# Patient Record
Sex: Male | Born: 1945 | Race: White | Hispanic: No | Marital: Married | Smoking: Former smoker
Health system: Southern US, Community
[De-identification: ages and names within clinical notes are randomized; demographics above are authoritative.]

## PROBLEM LIST (undated history)

## (undated) DIAGNOSIS — E7800 Pure hypercholesterolemia, unspecified: Secondary | ICD-10-CM

## (undated) DIAGNOSIS — I10 Essential (primary) hypertension: Secondary | ICD-10-CM

## (undated) DIAGNOSIS — K219 Gastro-esophageal reflux disease without esophagitis: Secondary | ICD-10-CM

## (undated) DIAGNOSIS — E119 Type 2 diabetes mellitus without complications: Secondary | ICD-10-CM

## (undated) DIAGNOSIS — M1990 Unspecified osteoarthritis, unspecified site: Secondary | ICD-10-CM

## (undated) DIAGNOSIS — G629 Polyneuropathy, unspecified: Secondary | ICD-10-CM

## (undated) HISTORY — DX: Polyneuropathy, unspecified: G62.9

## (undated) HISTORY — PX: ROTATOR CUFF REPAIR: SHX139

## (undated) HISTORY — DX: Unspecified osteoarthritis, unspecified site: M19.90

## (undated) HISTORY — DX: Gastro-esophageal reflux disease without esophagitis: K21.9

## (undated) HISTORY — DX: Type 2 diabetes mellitus without complications: E11.9

## (undated) HISTORY — DX: Essential (primary) hypertension: I10

## (undated) HISTORY — PX: TURBINATE RESECTION: SHX293

## (undated) HISTORY — DX: Pure hypercholesterolemia, unspecified: E78.00

---

## 2001-12-27 DIAGNOSIS — C801 Malignant (primary) neoplasm, unspecified: Secondary | ICD-10-CM

## 2001-12-27 HISTORY — PX: HERNIA REPAIR: SHX51

## 2001-12-27 HISTORY — DX: Malignant (primary) neoplasm, unspecified: C80.1

## 2016-12-27 HISTORY — PX: BACK SURGERY: SHX140

## 2019-12-28 DIAGNOSIS — K2270 Barrett's esophagus without dysplasia: Secondary | ICD-10-CM

## 2019-12-28 HISTORY — DX: Barrett's esophagus without dysplasia: K22.70

## 2021-03-12 HISTORY — PX: COLONOSCOPY: SHX174

## 2021-05-07 ENCOUNTER — Ambulatory Visit (INDEPENDENT_AMBULATORY_CARE_PROVIDER_SITE_OTHER): Payer: Medicare Other | Admitting: Podiatry

## 2021-05-07 ENCOUNTER — Other Ambulatory Visit: Payer: Self-pay

## 2021-05-07 DIAGNOSIS — B351 Tinea unguium: Secondary | ICD-10-CM | POA: Diagnosis not present

## 2021-05-07 DIAGNOSIS — Z794 Long term (current) use of insulin: Secondary | ICD-10-CM

## 2021-05-07 DIAGNOSIS — E1142 Type 2 diabetes mellitus with diabetic polyneuropathy: Secondary | ICD-10-CM | POA: Diagnosis not present

## 2021-05-07 DIAGNOSIS — M79675 Pain in left toe(s): Secondary | ICD-10-CM | POA: Diagnosis not present

## 2021-05-07 DIAGNOSIS — M79674 Pain in right toe(s): Secondary | ICD-10-CM

## 2021-05-08 ENCOUNTER — Encounter: Payer: Self-pay | Admitting: Podiatry

## 2021-05-08 NOTE — Progress Notes (Signed)
Subjective: Dale Fox presents today referred by Johna Roles, PA for diabetic foot evaluation.  Patient relates greater than 3 year history of diabetes.  Patient denies any history of foot wounds.  Patient denies any history of numbness, tingling, burning, pins/needles sensations.  No past medical history on file.  There are no problems to display for this patient.     Current Outpatient Medications on File Prior to Visit  Medication Sig Dispense Refill  . amLODipine (NORVASC) 5 MG tablet Take by mouth.    . famotidine (PEPCID) 20 MG tablet TAKE 1 TABLET BY MOUTH ONCE DAILY AT BEDTIME AS NEEDED FOR 90 DAYS    . Lancets (ONETOUCH DELICA PLUS [id number]) MISC USE TO CHECK YOUR BLOOD SUGAR TO OBTAIN BLOOD ONCE DAILY    . aspirin 81 MG EC tablet Take by mouth.    Marland Kitchen atorvastatin (LIPITOR) 10 MG tablet Take 1 tablet by mouth daily.    Marland Kitchen BISACODYL 5 MG EC tablet See admin instructions.    . budesonide (PULMICORT) 0.5 MG/2ML nebulizer solution Inhale into the lungs.    . Fluticasone Propionate (XHANCE) 93 MCG/ACT EXHU Place into the nose.    . gabapentin (NEURONTIN) 100 MG capsule Take 100 mg by mouth daily.    Marland Kitchen levocetirizine (XYZAL) 5 MG tablet Take by mouth.    . losartan-hydrochlorothiazide (HYZAAR) 100-12.5 MG tablet Take 1 tablet by mouth daily.    . metFORMIN (GLUCOPHAGE-XR) 500 MG 24 hr tablet SMARTSIG:1 Tablet(s) By Mouth Every Evening    . ofloxacin (FLOXIN) 0.3 % OTIC solution 5 drops 2 (two) times daily.    Marland Kitchen omeprazole (PRILOSEC) 40 MG capsule Take 1 capsule by mouth every morning.     No current facility-administered medications on file prior to visit.     Allergies  Allergen Reactions  . Amoxicillin-Pot Clavulanate Swelling  . Tizanidine Swelling    Social History   Occupational History  . Not on file  Tobacco Use  . Smoking status: Not on file  . Smokeless tobacco: Not on file  Substance and Sexual Activity  . Alcohol use: Not on file  .  Drug use: Not on file  . Sexual activity: Not on file    No family history on file.   There is no immunization history on file for this patient.  Review of systems: Positive Findings in bold print.  Constitutional:  chills, fatigue, fever, sweats, weight change Communication: Optometrist, sign Ecologist, hand writing, iPad/Android device Head: headaches, head injury Eyes: changes in vision, eye pain, glaucoma, cataracts, macular degeneration, diplopia, glare,  light sensitivity, eyeglasses or contacts, blindness Ears nose mouth throat: hearing impaired, hearing aids,  ringing in ears, deaf, sign language,  vertigo, nosebleeds,  rhinitis,  cold sores, snoring, swollen glands Cardiovascular: HTN, edema, arrhythmia, pacemaker in place, defibrillator in place, chest pain/tightness, chronic anticoagulation, blood clot, heart failure, MI Peripheral Vascular: leg cramps, varicose veins, blood clots, lymphedema, varicosities Respiratory:  asthma, difficulty breathing, denies congestion, SOB, wheezing, cough, emphysema Gastrointestinal: change in appetite or weight, abdominal pain, constipation, diarrhea, nausea, vomiting, vomiting blood, change in bowel habits, abdominal pain, jaundice, rectal bleeding, hemorrhoids, GERD Genitourinary:  nocturia,  pain on urination, polyuria,  blood in urine, Foley catheter, urinary urgency, ESRD on hemodialysis Musculoskeletal: amputation, cramping, stiff joints, painful joints, decreased joint motion, fractures, OA, gout, hemiplegia, paraplegia, uses cane, wheelchair bound, uses walker, uses rollator Skin: +changes in toenails, color change, dryness, itching, mole changes,  rash, wound(s) Neurological: headaches, numbness in feet, paresthesias  in feet, burning in feet, fainting,  seizures, change in speech, migraines, memory problems/poor historian, cerebral palsy, weakness, paralysis, CVA, TIA Endocrine: diabetes, hypothyroidism, hyperthyroidism,  goiter,  dry mouth, flushing, heat intolerance, cold intolerance,  excessive thirst, denies polyuria,  nocturia Hematological:  easy bleeding, excessive bleeding, easy bruising, enlarged lymph nodes, on long term blood thinner, history of past transusions Allergy/immunological:  hives, eczema, frequent infections, multiple drug allergies, seasonal allergies, transplant recipient, multiple food allergies Psychiatric:  anxiety, depression, mood disorder, suicidal ideations, hallucinations, insomnia  Objective: There were no vitals filed for this visit. Vascular Examination: Capillary refill time less than 3 seconds x 10 digits.  Dorsalis pedis pulses palpable 2 out of 4.  Posterior tibial pulses palpable 2 out of 4.  Digital hair not present x 10 digits.  Skin temperature gradient WNL b/l.  Dermatological Examination: Skin with normal turgor, texture and tone b/l  Toenails 1-5 b/l discolored, thick, dystrophic with subungual debris and pain with palpation to nailbeds due to thickness of nails.  Musculoskeletal: Muscle strength 5/5 to all LE muscle groups.  Neurological: Sensation slightly diminished with 10 gram monofilament.  Vibratory sensation intact.  Assessment: 1. NIDDM 2. Encounter for diabetic foot examination  Plan: 1. Discussed diabetic foot care principles. Literature dispensed on today. 2. Patient to continue soft, supportive shoe gear daily. 3. Patient to report any pedal injuries to medical professional immediately. 4. Follow up one year. 5. Patient/POA to call should there be a concern in the interim.

## 2021-09-11 ENCOUNTER — Other Ambulatory Visit: Payer: Self-pay

## 2021-09-11 ENCOUNTER — Ambulatory Visit (INDEPENDENT_AMBULATORY_CARE_PROVIDER_SITE_OTHER): Payer: Medicare Other | Admitting: Podiatry

## 2021-09-11 DIAGNOSIS — M79674 Pain in right toe(s): Secondary | ICD-10-CM

## 2021-09-11 DIAGNOSIS — E1142 Type 2 diabetes mellitus with diabetic polyneuropathy: Secondary | ICD-10-CM | POA: Diagnosis not present

## 2021-09-11 DIAGNOSIS — M2041 Other hammer toe(s) (acquired), right foot: Secondary | ICD-10-CM | POA: Diagnosis not present

## 2021-09-11 DIAGNOSIS — Z794 Long term (current) use of insulin: Secondary | ICD-10-CM

## 2021-09-11 DIAGNOSIS — B351 Tinea unguium: Secondary | ICD-10-CM | POA: Diagnosis not present

## 2021-09-11 DIAGNOSIS — M2042 Other hammer toe(s) (acquired), left foot: Secondary | ICD-10-CM

## 2021-09-11 DIAGNOSIS — M79675 Pain in left toe(s): Secondary | ICD-10-CM

## 2021-09-11 NOTE — Progress Notes (Signed)
  Subjective:  Patient ID: Dale Fox, male    DOB: July 25, 1946,  MRN: [id number]  Chief Complaint  Patient presents with   Callouses    Callus pain    75 y.o. male returns for the above complaint.  Patient presents with thickened elongated dystrophic toenails x10.  Patient would like for me to debride down the painful to touch.  He is not able to do it himself.  He also would like to get diabetic shoes.  He states that he has not had them in the past.  He denies any other acute issues  Objective:  There were no vitals filed for this visit. Podiatric Exam: Vascular: dorsalis pedis and posterior tibial pulses are palpable bilateral. Capillary return is immediate. Temperature gradient is WNL. Skin turgor WNL  Sensorium: Normal Semmes Weinstein monofilament test. Normal tactile sensation bilaterally. Nail Exam: Pt has thick disfigured discolored nails with subungual debris noted bilateral entire nail hallux through fifth toenails.  Pain on palpation to the nails. Ulcer Exam: There is no evidence of ulcer or pre-ulcerative changes or infection. Orthopedic Exam: Muscle tone and strength are WNL. No limitations in general ROM. No crepitus or effusions noted.  Semiflexible hammertoe contractures noted 2 through 5 bilaterally.  Mild pain on palpation of Skin: No Porokeratosis. No infection or ulcers.      Assessment & Plan:   1. Hammertoe, bilateral   2. Type 2 diabetes mellitus with diabetic polyneuropathy, with long-term current use of insulin (HCC)   3. Pain due to onychomycosis of toenails of both feet     Patient was evaluated and treated and all questions answered.  Hammertoe bilateral - Patient the etiology of hammertoe contractures and various treatment options were discussed.  Given the nature of the contracture in the setting of diabetes I believe patient will benefit from diabetic shoes with insoles to allow space in depth and not put a lot of pressure and prevent  ulceration.  I discussed this with the patient and he states understand would like to obtain diabetic shoes -He will be scheduled with the nursing staff for diabetic shoes  Onychomycosis with pain  -Nails palliatively debrided as below. -Educated on self-care  Procedure: Nail Debridement Rationale: pain  Type of Debridement: manual, sharp debridement. Instrumentation: Nail nipper, rotary burr. Number of Nails: 10  Procedures and Treatment: Consent by patient was obtained for treatment procedures. The patient understood the discussion of treatment and procedures well. All questions were answered thoroughly reviewed. Debridement of mycotic and hypertrophic toenails, 1 through 5 bilateral and clearing of subungual debris. No ulceration, no infection noted.  Return Visit-Office Procedure: Patient instructed to return to the office for a follow up visit 3 months for continued evaluation and treatment.  Boneta Lucks, DPM    Return in about 3 months (around 12/11/2021), or With Posey Pronto for [place] [place].

## 2021-09-18 ENCOUNTER — Other Ambulatory Visit: Payer: Self-pay | Admitting: Physician Assistant

## 2021-09-18 ENCOUNTER — Ambulatory Visit
Admission: RE | Admit: 2021-09-18 | Discharge: 2021-09-18 | Disposition: A | Discharge status: Home / Self Care | Payer: Medicare Other | Source: Ambulatory Visit | Attending: Physician Assistant | Admitting: Physician Assistant

## 2021-09-18 DIAGNOSIS — R051 Acute cough: Secondary | ICD-10-CM

## 2021-09-21 ENCOUNTER — Ambulatory Visit (INDEPENDENT_AMBULATORY_CARE_PROVIDER_SITE_OTHER): Payer: Medicare Other | Admitting: Podiatry

## 2021-09-21 ENCOUNTER — Other Ambulatory Visit: Payer: Self-pay

## 2021-09-21 DIAGNOSIS — E1142 Type 2 diabetes mellitus with diabetic polyneuropathy: Secondary | ICD-10-CM

## 2021-09-21 DIAGNOSIS — M2042 Other hammer toe(s) (acquired), left foot: Secondary | ICD-10-CM

## 2021-09-21 DIAGNOSIS — Z794 Long term (current) use of insulin: Secondary | ICD-10-CM

## 2021-09-21 DIAGNOSIS — M2041 Other hammer toe(s) (acquired), right foot: Secondary | ICD-10-CM

## 2021-09-21 NOTE — Progress Notes (Signed)
Patient presented for foam casting for 3 pair custom diabetic shoe inserts. Patient is a size 9 1/2 wide  Diabetic shoes are chosen from the [employer] step catalog  The shoes chosen are 410   The patient will be contacted when the shoes and the inserts are ready to be picked up

## 2021-09-29 ENCOUNTER — Telehealth: Payer: Self-pay | Admitting: Podiatry

## 2021-09-29 NOTE — Telephone Encounter (Signed)
Left message for Rose the assistant for [facility] PA asking who the overseeing md/DO is for the PA.

## 2021-10-14 ENCOUNTER — Telehealth: Payer: Self-pay | Admitting: Podiatry

## 2021-10-14 NOTE — Telephone Encounter (Signed)
Pts wife left message asking if pts shoes have came in yet.She asked that I text her [place].  Upon looking the documents needed are not signed yet but pcp. I am not able to text pt and pt does not have my chart set up so I cannot send them a message that way.

## 2021-10-19 ENCOUNTER — Telehealth: Payer: Self-pay | Admitting: Podiatry

## 2021-10-19 NOTE — Telephone Encounter (Signed)
Pts spouse(Sandra) left message checking on status of pts diabetic shoes.  I returned call and explained we have faxed the documents needed 6 times and have not gotten it back.  She is calling the office tomorrow.

## 2021-10-27 ENCOUNTER — Ambulatory Visit
Admission: RE | Admit: 2021-10-27 | Discharge: 2021-10-27 | Disposition: A | Discharge status: Home / Self Care | Payer: Medicare Other | Source: Ambulatory Visit | Attending: Physician Assistant | Admitting: Physician Assistant

## 2021-10-27 ENCOUNTER — Other Ambulatory Visit: Payer: Self-pay | Admitting: Physician Assistant

## 2021-10-27 DIAGNOSIS — G8929 Other chronic pain: Secondary | ICD-10-CM

## 2021-10-27 DIAGNOSIS — M5489 Other dorsalgia: Secondary | ICD-10-CM

## 2021-11-04 ENCOUNTER — Encounter: Payer: Self-pay | Admitting: Pulmonary Disease

## 2021-11-04 ENCOUNTER — Other Ambulatory Visit: Payer: Self-pay

## 2021-11-04 ENCOUNTER — Telehealth: Payer: Self-pay | Admitting: Podiatry

## 2021-11-04 ENCOUNTER — Ambulatory Visit (INDEPENDENT_AMBULATORY_CARE_PROVIDER_SITE_OTHER): Payer: Medicare Other | Admitting: Pulmonary Disease

## 2021-11-04 VITALS — BP 140/70 | HR 65 | Temp 97.7°F | Ht 70.0 in | Wt 172.2 lb

## 2021-11-04 DIAGNOSIS — R053 Chronic cough: Secondary | ICD-10-CM | POA: Diagnosis not present

## 2021-11-04 NOTE — Patient Instructions (Signed)
Nice to meet you  I think the first up in the evaluation will be having a gastroenterologist to evaluate for ongoing silent reflux which could be triggering your dry cough.  Also, to help follow-up or evaluate for ongoing surveillance for Barrett's esophagus in the past.  I do not think asthma is a major cause, not improved with inhalers nor Dupixent.  Sounds like a sinus congestion, postnasal drip is much better after your procedures for polyps.  Do not think postnasal drip is a major issue either.  Is possible there is developed a feedback loop that is causing the cough.  This is usually triggered by nerves.  We may need to increase gabapentin in the future if reflux is not an issue per gastroenterology evaluation.  Please let me know what the GI doctor says and let me know when they have done tests or when they result and I can review as well.  Return to clinic in 3 months or sooner as needed with Dr. Silas Flood

## 2021-11-04 NOTE — Telephone Encounter (Signed)
Pts wife left message checking on status of diabetic shoes. She stated the pcp stated they had signed the documents and sent them back.  Upon checking we did get them and the inserts appear to be in production. It looks like they should be shipping in the next week or so and I notified pts wife of this and that I would call when they come in to schedule pt to pick them up.

## 2021-11-05 NOTE — Progress Notes (Signed)
@Patient  ID: Dale Fox, male    DOB: 01/30/1946, 75 y.o.   MRN: [id number]  Chief Complaint  Patient presents with   Follow-up    Pt states chronic cough x 15 years    Referring provider: Johna Roles, PA  HPI:   75 y.o. man whom we are seeing in consultation for evaluation of chronic cough.  Note from referring provider reviewed.  Patient has had cough for 15+ years.  It is dry, nonproductive.  Has coughing fits throughout the day.  Short burst of cough.  Fine in between.  Seems worse when lying supine, at night.  ICS/LABA inhalers have not been helpful.  His postnasal drip is well treated status post surgery, no further symptoms yet cough is no different.  He is on Dupixent for nasal polyposis and cough is no different.  He has GERD and is on famotidine as well as a PPI.  Cough is no different.  No other time of day where things are better or worse.  No seasonal environmental factors make things better or worse.  No other relieving or exacerbating factors.  He has a diagnosis of asthma throughout this that was related to cough.  Again, treated for this as you have no improvement in cough.  Reviewed chest x-ray 08/2021 is on my review and interpretation demonstrates clear lungs with hyperinflation, likely indicative of small airways disease or asthma.  He is a never smoker.  He has longstanding history of GERD.  He denies reflux or heartburn symptoms.  However, he does relate that he had Barrett's esophagus in 2019 at [facility].  Had planned further surveillance although he is unsure of plan.  Has moved and no longer has GI care.  PMH: Asthma, hypertension, nasal polyposis Surgical history: Endoscopic sinus surgery/polyp removal Family history:History reviewed. No pertinent family history. Social history: Never smoker, grew up in [place], now lives in [place], worked as a Environmental education officer, [place], and a nonprofit, now retired  Licensed conveyancer / Pulmonary  Flowsheets:   ACT:  No Duran found.  MMRC: No flowsheet data found.  Epworth:  No flowsheet data found.  Tests:   FENO:  No results found for: NITRICOXIDE  PFT: No flowsheet data found.  WALK:  No flowsheet data found.  Imaging: Personally reviewed and as per EMR discussion this note DG Lumbar Spine 2-3 Views  Result Date: 10/27/2021 CLINICAL DATA:  Low back pain EXAM: LUMBAR SPINE - 2-3 VIEW COMPARISON:  None. FINDINGS: Markedly limited evaluation due to overlapping osseous structures and overlying soft tissues. Five non-rib-bearing lumbar vertebral bodies. Multilevel moderate to severe degenerative changes of the spine. There is no evidence of lumbar spine fracture. Slightly levocurvature centered at the L3 level. Otherwise alignment is normal. Intervertebral disc spaces are maintained. IMPRESSION: No acute displaced fracture or traumatic listhesis of the lumbar spine in a patient with multilevel degenerative changes of the spine. Electronically Signed   By: Iven Finn M.D.   On: 10/27/2021 19:38   DG Pelvis 1-2 Views  Result Date: 10/27/2021 CLINICAL DATA:  Neck pain. EXAM: PELVIS - 1-2 VIEW COMPARISON:  None. FINDINGS: There is no evidence of pelvic fracture or diastasis. No pelvic bone lesions are seen. Prostate radiotherapy seeds are present. There are mild degenerative changes of both hips with joint space narrowing and osteophyte formation. IMPRESSION: 1. No acute bony abnormality. 2. Mild degenerative changes of the hips. Electronically Signed   By: Ronney Asters M.D.   On: 10/27/2021 19:50  Lab Results:  CBC No results found for: WBC, RBC, HGB, HCT, PLT, MCV, MCH, MCHC, RDW, LYMPHSABS, MONOABS, EOSABS, BASOSABS  BMET No results found for: NA, K, CL, CO2, GLUCOSE, BUN, CREATININE, CALCIUM, GFRNONAA, GFRAA  BNP No results found for: BNP  ProBNP No results found for: PROBNP  Specialty Problems   None   Allergies  Allergen Reactions    Amoxicillin-Pot Clavulanate Swelling   Tizanidine Swelling    Immunization History  Administered Date(s) Administered   Moderna Sars-Covid-2 Vaccination 10/22/2020, 04/25/2021    History reviewed. No pertinent past medical history.  Tobacco History: Social History   Tobacco Use  Smoking Status Former   Types: Cigarettes   Start date: 1962   Quit date: 1978   Years since quitting: 44.8  Smokeless Tobacco Never  Tobacco Comments   1 pack would last pt 1 week   Counseling given: Not Answered Tobacco comments: 1 pack would last pt 1 week   Continue to not smoke  Outpatient Encounter Medications as of 11/04/2021  Medication Sig   amLODipine (NORVASC) 5 MG tablet Take by mouth.   aspirin 81 MG EC tablet Take by mouth.   atorvastatin (LIPITOR) 10 MG tablet Take 1 tablet by mouth daily.   BISACODYL 5 MG EC tablet See admin instructions.   carvedilol (COREG) 6.25 MG tablet Take by mouth.   Dupilumab (DUPIXENT) 300 MG/2ML SOPN    famotidine (PEPCID) 20 MG tablet TAKE 1 TABLET BY MOUTH ONCE DAILY AT BEDTIME AS NEEDED FOR 90 DAYS   Fluticasone Propionate (XHANCE) 93 MCG/ACT EXHU Place into the nose.   gabapentin (NEURONTIN) 100 MG capsule Take 100 mg by mouth daily.   Lancets (ONETOUCH DELICA PLUS [id number]) MISC USE TO CHECK YOUR BLOOD SUGAR TO OBTAIN BLOOD ONCE DAILY   levocetirizine (XYZAL) 5 MG tablet Take by mouth.   losartan-hydrochlorothiazide (HYZAAR) 100-12.5 MG tablet Take 1 tablet by mouth daily.   metFORMIN (GLUCOPHAGE-XR) 500 MG 24 hr tablet SMARTSIG:1 Tablet(s) By Mouth Every Evening   ofloxacin (FLOXIN) 0.3 % OTIC solution 5 drops 2 (two) times daily.   ONETOUCH VERIO test strip SMARTSIG:Strip(s) Via Meter Daily   budesonide (PULMICORT) 0.5 MG/2ML nebulizer solution Inhale into the lungs. (Patient not taking: Reported on 11/04/2021)   omeprazole (PRILOSEC) 40 MG capsule Take 1 capsule by mouth every morning. (Patient not taking: Reported on 11/04/2021)   No  facility-administered encounter medications on file as of 11/04/2021.     Review of Systems  Review of Systems  No chest pain with exertion.  No orthopnea or PND.  No lower EXTR swelling.  Comprehensive review of systems otherwise negative. Physical Exam  BP 140/70 (BP Location: Left Arm, Patient Position: Sitting, Cuff Size: Normal)   Pulse 65   Temp 97.7 F (36.5 C) (Oral)   Ht 5\' 10"  (1.778 m)   Wt 172 lb 3.2 oz (78.1 kg)   SpO2 98%   BMI 24.71 kg/m   Wt Readings from Last 5 Encounters:  11/04/21 172 lb 3.2 oz (78.1 kg)    BMI Readings from Last 5 Encounters:  11/04/21 24.71 kg/m     Physical Exam General: Well-appearing, no acute distress Eyes: EOMI, icterus Neck: Supple, no JVP Pulmonary: Clear, good air excursion, normal work of breathing Cardiovascular: Regular rate and rhythm, no murmurs Abdomen: Nondistended, bowel sounds present MSK: No synovitis, no joint effusion Neuro: No weakness, normal gait Psych: Normal mood, full affect   Assessment & Plan:   Chronic cough: Likely initially multifactorial but  not present for 15+ years.  Suspect neurogenic at this point.  He has trialed and failed multiple ICS/LABA inhalers and currently is on Dupixent without improvement.  Asthma felt unlikely.  Prior had postnasal drip but after his sinus surgery for polyps he no longer has nasal congestion nor the sensation of postnasal drip.  Low suspicion for postnasal drip.  He has documented GERD.  Barrett's esophagus reportedly in 2019 while he lived in [place], [place].  Cough worse when lies down.  High suspicion for silent reflux particularly in the evenings with nocturnal worsening of symptoms while supine.  Referral to GI for further evaluation, consideration of manometry, pH probe.  Consider using gabapentin for neurogenic cough if reflux is ruled out.  Asthma: Well-controlled.  Gas trapping on chest x-ray.  No dyspnea symptoms.  Cough felt unrelated asthma given no change  with escalation of asthma therapies as above.   Return in about 3 months (around 02/04/2022).   Lanier Clam, MD 11/05/2021   This appointment required 65 minutes of patient care (this includes precharting, chart review, review of results, face-to-face care, etc.).

## 2021-11-09 ENCOUNTER — Encounter: Payer: Self-pay | Admitting: Gastroenterology

## 2021-11-26 ENCOUNTER — Other Ambulatory Visit: Payer: Self-pay

## 2021-11-26 ENCOUNTER — Ambulatory Visit: Payer: Medicare Other

## 2021-11-26 DIAGNOSIS — E1142 Type 2 diabetes mellitus with diabetic polyneuropathy: Secondary | ICD-10-CM | POA: Diagnosis not present

## 2021-11-26 DIAGNOSIS — M2042 Other hammer toe(s) (acquired), left foot: Secondary | ICD-10-CM | POA: Diagnosis not present

## 2021-11-26 DIAGNOSIS — M2041 Other hammer toe(s) (acquired), right foot: Secondary | ICD-10-CM | POA: Diagnosis not present

## 2021-11-26 NOTE — Progress Notes (Signed)
SITUATION Reason for Visit: Fitting of Diabetic Shoes & Insoles Patient / Caregiver Report:  Patient is comfortable in shoes  OBJECTIVE DATA: Patient History / Diagnosis:  Type 2 diabetes mellitus with diabetic polyneuropathy, with long-term current use of insulin ([place])  Change in Status:   None  ACTIONS PERFORMED: In-Person Delivery, patient was fit with: - 1x pair [id number] PDAC approved prefabricated Diabetic Shoes: Orthofeet 410 9.5W - 3x pair [id number] PDAC approved CAM milled custom diabetic insoles  Shoes and insoles were verified for structural integrity and safety. Patient wore shoes and insoles in office. Skin was inspected and free of areas of concern after wearing shoes and inserts. Shoes and inserts fit properly. Patient / Caregiver provided with ferbal instruction and demonstration regarding donning, doffing, wear, care, proper fit, function, purpose, cleaning, and use of shoes and insoles ' and in all related precautions and risks and benefits regarding shoes and insoles. Patient / Caregiver was instructed to wear properly fitting socks with shoes at all times. Patient was also provided with verbal instruction regarding how to report any failures or malfunctions of shoes or inserts, and necessary follow up care. Patient / Caregiver was also instructed to contact physician regarding change in status that may affect function of shoes and inserts.   Patient / Caregiver verbalized undersatnding of instruction provided. Patient / Caregiver demonstrated independence with proper donning and doffing of shoes and inserts.  PLAN Patient to follow up as needed. Plan of care was discussed with and agreed upon by patient and/or caregiver. All questions were answered and concerns addressed.

## 2021-12-07 ENCOUNTER — Other Ambulatory Visit: Payer: Self-pay

## 2021-12-07 ENCOUNTER — Ambulatory Visit (INDEPENDENT_AMBULATORY_CARE_PROVIDER_SITE_OTHER): Payer: Medicare Other | Admitting: Gastroenterology

## 2021-12-07 VITALS — BP 130/78 | HR 82 | Ht 70.0 in | Wt 173.0 lb

## 2021-12-07 DIAGNOSIS — R059 Cough, unspecified: Secondary | ICD-10-CM

## 2021-12-07 NOTE — Patient Instructions (Signed)
If you are age 76 or older, your body mass index should be between 23-30. Your Body mass index is 24.82 kg/m. If this is out of the aforementioned range listed, please consider follow up with your Primary Care Provider.  If you are age 71 or younger, your body mass index should be between 19-25. Your Body mass index is 24.82 kg/m. If this is out of the aformentioned range listed, please consider follow up with your Primary Care Provider.   ________________________________________________________  The [facility] providers would like to encourage you to use [facility] to communicate with providers for non-urgent requests or questions.  Due to long hold times on the telephone, sending your provider a message by [facility] may be a faster and more efficient way to get a response.  Please allow 48 business hours for a response.  Please remember that this is for non-urgent requests.  _______________________________________________________   Please call Dr. Leland Her nurse in 2 weeks at [phone]  to let her now how you are doing.   Thank you,  Dr. Jackquline Denmark

## 2021-12-07 NOTE — Progress Notes (Signed)
Chief Complaint: Cough  Referring Provider:  Johna Roles, PA      ASSESSMENT AND PLAN;   #1. Cough likely d/t Losartan. Doubt GERD as cause. Neg pulm/ENT eval.  #2. GERD with small HH/??Barrett's eso on Bx  Plan: -Protonix 40mg  po BID, pepcid 20mg  po QHS to continue as already being done. -Obtain Bx results from [facility] endoscopy, [place] (RE?  Barrett's). If +, rpt EGD 03/2023 -Cough is likely d/t losartan.  Recommend stopping it x 2 weeks to determine if cough improves. -Recommend to hold off on further GI WU until then.  If he still has problems, proceed with UGI series followed by 24h ph/manometry.  D/W pt and pt's wife They would get in touch with Minette Brine prior to stopping losartan   HPI:    Dale Fox is a 75 y.o. male  With DM2, HTN, HLD, OA, neuropathy  C/O cough x 15 yrs,  Starts off like tickle, then paroxysmal dry cough which happens even in sleep. Had extensive neg ENT eval, pulmonary eval Thought to be d/t GERD.  Has been on Protonix 40 BID and Pepcid 20 QHS  Continues to have cough.  No heartburn, odynophagia or dysphagia.  He denies having any regurgitation, N/V.  No diarrhea or constipation.  Very frustrated with cough.  So is his wife.  Previous GI work-up: Brings in his previous EGD report and pictures-from [place] endoscopy 04/08/2020 -Normal esophagus (biopsed) -Low-grade narrowing Schatzki's ring s/p balloon dilatation - NO further dysphagia. -Normal stomach (biopsed) -Normal duodenum We do not have biopsy reports yet.  Apparently patient was told that he had "Barrett's esophagus".  Colon 01/2021 [facility] GI): neg except polyps. Rpt  3 yrs.  Past Medical History:  Diagnosis Date   Barrett's esophagus 2021   Chronic GERD    Diabetes ([place])    High blood pressure    High cholesterol    Neuropathy    Osteoarthritis     Past Surgical History:  Procedure Laterality Date   BACK SURGERY  2018   HERNIA REPAIR  2003    ROTATOR CUFF REPAIR Right    2019   TURBINATE RESECTION      Family History  Problem Relation Age of Onset   Heart disease Mother    Colon cancer Neg Hx    Rectal cancer Neg Hx    Esophageal cancer Neg Hx     Social History   Tobacco Use   Smoking status: Former    Types: Cigarettes    Start date: 1962    Quit date: 1978    Years since quitting: 44.9   Smokeless tobacco: Never   Tobacco comments:    1 pack would last pt 1 week  Vaping Use   Vaping Use: Never used  Substance Use Topics   Alcohol use: Yes    Comment: occ   Drug use: Never    Current Outpatient Medications  Medication Sig Dispense Refill   amLODipine (NORVASC) 5 MG tablet Take by mouth.     aspirin 81 MG EC tablet Take by mouth.     atorvastatin (LIPITOR) 10 MG tablet Take 1 tablet by mouth daily.     benzonatate (TESSALON) 100 MG capsule Take 100 mg by mouth 3 (three) times daily.     carvedilol (COREG) 6.25 MG tablet Take by mouth.     Cyanocobalamin (VITAMIN B12 PO) Take 1 tablet by mouth daily.     Dupilumab (DUPIXENT) 300 MG/2ML SOPN  famotidine (PEPCID) 20 MG tablet TAKE 1 TABLET BY MOUTH ONCE DAILY AT BEDTIME AS NEEDED FOR 90 DAYS     gabapentin (NEURONTIN) 100 MG capsule Take 100 mg by mouth daily.     Lancets (ONETOUCH DELICA PLUS [id number]) MISC USE TO CHECK YOUR BLOOD SUGAR TO OBTAIN BLOOD ONCE DAILY     levocetirizine (XYZAL) 5 MG tablet Take by mouth.     losartan-hydrochlorothiazide (HYZAAR) 100-12.5 MG tablet Take 1 tablet by mouth daily.     metFORMIN (GLUCOPHAGE-XR) 500 MG 24 hr tablet SMARTSIG:1 Tablet(s) By Mouth Every Evening     montelukast (SINGULAIR) 10 MG tablet Take 10 mg by mouth daily.     Multiple Vitamin (MULTIVITAMIN) tablet Take 1 tablet by mouth daily.     ONETOUCH VERIO test strip SMARTSIG:Strip(s) Via Meter Daily     pantoprazole (PROTONIX) 40 MG tablet Take 40 mg by mouth 2 (two) times daily.     Probiotic Product (PROBIOTIC DAILY) CAPS Take 1 capsule by mouth  daily.     vitamin C (ASCORBIC ACID) 500 MG tablet Take 500 mg by mouth daily.     No current facility-administered medications for this visit.    Allergies  Allergen Reactions   Amoxicillin-Pot Clavulanate Swelling   Tizanidine Swelling    Review of Systems:  Constitutional: Denies fever, chills, diaphoresis, appetite change and fatigue.  HEENT: Denies photophobia, eye pain, redness, hearing loss, ear pain, congestion, sore throat, rhinorrhea, sneezing, mouth sores, neck pain, neck stiffness and tinnitus.   Respiratory: Denies SOB, DOE, cough, chest tightness,  and wheezing.   Cardiovascular: Denies chest pain, palpitations and leg swelling.  Genitourinary: Denies dysuria, urgency, frequency, hematuria, flank pain and difficulty urinating.  Musculoskeletal: Denies myalgias, back pain, joint swelling, arthralgias and gait problem.  Skin: No rash.  Neurological: Denies dizziness, seizures, syncope, weakness, light-headedness, numbness and headaches.  Hematological: Denies adenopathy. Easy bruising, personal or family bleeding history  Psychiatric/Behavioral: No anxiety or depression     Physical Exam:    BP 130/78   Pulse 82   Ht 5\' 10"  (1.778 m)   Wt 173 lb (78.5 kg)   SpO2 98%   BMI 24.82 kg/m  Wt Readings from Last 3 Encounters:  12/07/21 173 lb (78.5 kg)  11/04/21 172 lb 3.2 oz (78.1 kg)   Constitutional:  Well-developed, in no acute distress. Psychiatric: Normal mood and affect. Behavior is normal. HEENT: Pupils normal.  Conjunctivae are normal. No scleral icterus. Neck supple.  Cardiovascular: Normal rate, regular rhythm. No edema Pulmonary/chest: Effort normal and breath sounds normal. No wheezing, rales or rhonchi. Abdominal: Soft, nondistended. Nontender. Bowel sounds active throughout. There are no masses palpable. No hepatomegaly. Rectal: Deferred Neurological: Alert and oriented to person place and time. Skin: Skin is warm and dry. No rashes noted.  Data  Reviewed: I have personally reviewed following labs and imaging studies  No flowsheet data found.     Carmell Austria, MD 12/07/2021, 2:00 PM  Cc: Johna Roles, PA

## 2021-12-11 ENCOUNTER — Ambulatory Visit (INDEPENDENT_AMBULATORY_CARE_PROVIDER_SITE_OTHER): Payer: Medicare Other | Admitting: Podiatry

## 2021-12-11 ENCOUNTER — Other Ambulatory Visit: Payer: Self-pay

## 2021-12-11 DIAGNOSIS — Z794 Long term (current) use of insulin: Secondary | ICD-10-CM | POA: Diagnosis not present

## 2021-12-11 DIAGNOSIS — E1142 Type 2 diabetes mellitus with diabetic polyneuropathy: Secondary | ICD-10-CM | POA: Diagnosis not present

## 2021-12-11 DIAGNOSIS — M2041 Other hammer toe(s) (acquired), right foot: Secondary | ICD-10-CM

## 2021-12-11 DIAGNOSIS — M2042 Other hammer toe(s) (acquired), left foot: Secondary | ICD-10-CM | POA: Diagnosis not present

## 2021-12-15 NOTE — Progress Notes (Signed)
°  Subjective:  Patient ID: Dale Fox, male    DOB: 07-13-1946,  MRN: [id number]  Chief Complaint  Patient presents with   Diabetes    3 month follow up  PT stated that the shoes and orthotics are doing good    75 y.o. male returns for the above complaint.  Patient presents for follow-up to evaluate the diabetic shoes.  Patient states that there seem to be functioning fine he just wants to make sure that this is how it supposed to be.  He does not need any nail trims today.  He denies any other acute complaints  Objective:  There were no vitals filed for this visit. Podiatric Exam: Vascular: dorsalis pedis and posterior tibial pulses are palpable bilateral. Capillary return is immediate. Temperature gradient is WNL. Skin turgor WNL  Sensorium: Normal Semmes Weinstein monofilament test. Normal tactile sensation bilaterally. Nail Exam: Pt has thick disfigured discolored nails with subungual debris noted bilateral entire nail hallux through fifth toenails.  Pain on palpation to the nails. Ulcer Exam: There is no evidence of ulcer or pre-ulcerative changes or infection. Orthopedic Exam: Muscle tone and strength are WNL. No limitations in general ROM. No crepitus or effusions noted.  Semiflexible hammertoe contractures noted 2 through 5 bilaterally.  Mild pain on palpation of Skin: No Porokeratosis. No infection or ulcers.      Assessment & Plan:   1. Hammertoe, bilateral   2. Type 2 diabetes mellitus with diabetic polyneuropathy, with long-term current use of insulin ([place])      Patient was evaluated and treated and all questions answered.  Hammertoe bilateral - Patient the etiology of hammertoe contractures and various treatment options were discussed.  Given the nature of the contracture in the setting of diabetes I believe patient will benefit from diabetic shoes with insoles to allow space in depth and not put a lot of pressure and prevent ulceration.  I discussed this with  the patient and he states understand would like to obtain diabetic shoes -Diabetic shoes are fitting well and functioning well.  Onychomycosis with pain  -Nails palliatively debrided as below. -Educated on self-care  Procedure: Nail Debridement Rationale: pain  Type of Debridement: manual, sharp debridement. Instrumentation: Nail nipper, rotary burr. Number of Nails: 10  Procedures and Treatment: Consent by patient was obtained for treatment procedures. The patient understood the discussion of treatment and procedures well. All questions were answered thoroughly reviewed. Debridement of mycotic and hypertrophic toenails, 1 through 5 bilateral and clearing of subungual debris. No ulceration, no infection noted.  Return Visit-Office Procedure: Patient instructed to return to the office for a follow up visit 3 months for continued evaluation and treatment.  Boneta Lucks, DPM    No follow-ups on file.

## 2022-01-07 ENCOUNTER — Telehealth: Payer: Self-pay | Admitting: Gastroenterology

## 2022-01-07 NOTE — Telephone Encounter (Signed)
Patient called to give update on his cough.  He stopped taking the Losartan and the cough seemed to worsen.  He went to Urgent Care about a week ago and they prescribed gel eye drops and Afrin which seemed to help for a little while.  He states the cough has come back and the drops and Afrin are no longer helping him.  He has an appointment scheduled with Dr. Lyndel Safe on 1/31 for follow up.  Thank you.

## 2022-01-07 NOTE — Telephone Encounter (Signed)
Left message for pt to call back  °

## 2022-01-08 NOTE — Telephone Encounter (Signed)
°  Spoke with pt wife Katharine Look to confirm message that was sent prior.  Katharine Look stated that Dr. Lyndel Safe suggested that pt stop taking the Losartan.   Katharine Look reminded of the Appointment scheduled of 01/26/2022  Katharine Look verbalized understanding with all questions answered

## 2022-01-26 ENCOUNTER — Ambulatory Visit (INDEPENDENT_AMBULATORY_CARE_PROVIDER_SITE_OTHER): Payer: Medicare Other | Admitting: Gastroenterology

## 2022-01-26 ENCOUNTER — Encounter: Payer: Self-pay | Admitting: Gastroenterology

## 2022-01-26 ENCOUNTER — Other Ambulatory Visit: Payer: Self-pay

## 2022-01-26 VITALS — BP 140/74 | HR 83 | Ht 70.0 in | Wt 172.0 lb

## 2022-01-26 DIAGNOSIS — R059 Cough, unspecified: Secondary | ICD-10-CM

## 2022-01-26 DIAGNOSIS — R1319 Other dysphagia: Secondary | ICD-10-CM

## 2022-01-26 DIAGNOSIS — K219 Gastro-esophageal reflux disease without esophagitis: Secondary | ICD-10-CM | POA: Diagnosis not present

## 2022-01-26 DIAGNOSIS — K449 Diaphragmatic hernia without obstruction or gangrene: Secondary | ICD-10-CM

## 2022-01-26 NOTE — Patient Instructions (Addendum)
If you are age 77 or older, your body mass index should be between 23-30. Your Body mass index is 24.68 kg/m. If this is out of the aforementioned range listed, please consider follow up with your Primary Care Provider. __________________________________________________________  The [facility] providers would like to encourage you to use [facility] to communicate with providers for non-urgent requests or questions.  Due to long hold times on the telephone, sending your provider a message by [facility] may be a faster and more efficient way to get a response.  Please allow 48 business hours for a response.  Please remember that this is for non-urgent requests.    Due to recent changes in healthcare laws, you may see the results of your imaging and laboratory studies on MyChart before your provider has had a chance to review them.  We understand that in some cases there may be results that are confusing or concerning to you. Not all laboratory results come back in the same time frame and the provider may be waiting for multiple results in order to interpret others.  Please give [place] 48 hours in order for your provider to thoroughly review all the results before contacting the office for clarification of your results.   Continue taking Protonix, and Pepcid  You have been scheduled for an endoscopy. Please follow written instructions given to you at your visit today. If you use inhalers (even only as needed), please bring them with you on the day of your procedure.    You have been scheduled for a Barium Esophogram at [facility] (1st floor of the hospital) on 02/09/22 at 9:30am. Please arrive 15 minutes prior to your appointment for registration. Make certain not to have anything to eat or drink 3 hours prior to your test. If you need to reschedule for any reason, please contact radiology at [phone] to do so. __________________________________________________________________ A barium swallow is an  examination that concentrates on views of the esophagus. This tends to be a double contrast exam (barium and two liquids which, when combined, create a gas to distend the wall of the oesophagus) or single contrast (non-ionic iodine based). The study is usually tailored to your symptoms so a good history is essential. Attention is paid during the study to the form, structure and configuration of the esophagus, looking for functional disorders (such as aspiration, dysphagia, achalasia, motility and reflux) EXAMINATION You may be asked to change into a gown, depending on the type of swallow being performed. A radiologist and radiographer will perform the procedure. The radiologist will advise you of the type of contrast selected for your procedure and direct you during the exam. You will be asked to stand, sit or lie in several different positions and to hold a small amount of fluid in your mouth before being asked to swallow while the imaging is performed .In some instances you may be asked to swallow barium coated marshmallows to assess the motility of a solid food bolus. The exam can be recorded as a digital or video fluoroscopy procedure. POST PROCEDURE It will take 1-2 days for the barium to pass through your system. To facilitate this, it is important, unless otherwise directed, to increase your fluids for the next 24-48hrs and to resume your normal diet.  This test typically takes about 30 minutes to perform. __________________________________________________________________________________   It was a pleasure to see you today!  Jackquline Denmark, M.D.

## 2022-01-26 NOTE — Progress Notes (Signed)
Chief Complaint: FU  Referring Provider:  Johna Roles, PA      ASSESSMENT AND PLAN;   #1. GERD with eso dysphagia   #2. Cough ?  Etiology. Neg pulm/ENT eval/did not get better after stopping losartan.   Plan: -Protonix 40mg  po BID, pepcid 20mg  po QHS to continue as already being done. -Ba swallow with barium tablet (R/O Zenker's) -EGD with dil  Proceed with EGD. I have discussed the risks and benefits. The risks including rare risk of perforation, bleeding, missed UGI neoplasms, risks of anesthesia/sedation. Alternatives were given. Patient is aware and agrees to proceed. All the questions were answered. This will be scheduled in upcoming days. Consent forms were given for review.   HPI:    Dale Fox is a 76 y.o. male  With DM2, HTN, HLD, OA, neuropathy  C/O cough x 15 yrs,  Starts off like tickle, then paroxysmal dry cough which happens even in sleep. Had extensive neg ENT eval, pulmonary eval Thought to be d/t GERD.  Has been on Protonix 40 BID and Pepcid 20 QHS  Continues to have cough.  He stopped losartan x over 2 weeks without any relief from cough  Over the last few weeks, having problems with solid food dysphagia-food getting hung up in the lower chest.  He denies having any heartburn on twice daily Protonix and on Pepcid at bedtime.  No odynophagia.  No problems with liquids.   He denies having any regurgitation, N/V.  No diarrhea or constipation.  Very frustrated with cough.  So is his wife.  Wt Readings from Last 3 Encounters:  01/26/22 172 lb (78 kg)  12/07/21 173 lb (78.5 kg)  11/04/21 172 lb 3.2 oz (78.1 kg)     Previous GI work-up: Previous EGD report and pictures-from [place] endoscopy 04/08/2020 -Normal esophagus (biopsed) -Low-grade narrowing Schatzki's ring s/p balloon dilatation - NO further dysphagia. -Normal stomach (biopsed) -Normal duodenum We do not have biopsy reports yet.  Apparently patient was told that he had  "Barrett's esophagus".  Colon 01/2021 [facility] GI): neg except polyps. Rpt  3 yrs.  Past Medical History:  Diagnosis Date   Barrett's esophagus 2021   Chronic GERD    Diabetes ([place])    High blood pressure    High cholesterol    Neuropathy    Osteoarthritis     Past Surgical History:  Procedure Laterality Date   BACK SURGERY  2018   COLONOSCOPY  03/12/2021   [facility], Dr. Wilford Corner. One 3 mm polyp in the descending colon, removed with a cold biopsy forceps. Resected and retrived. Diverticulosis in the sigmoid colon. Internal hemorrhoids. The examined portion of the ileum was normal.   HERNIA REPAIR  2003   ROTATOR CUFF REPAIR Right    2019   TURBINATE RESECTION      Family History  Problem Relation Age of Onset   Heart disease Mother    Colon cancer Neg Hx    Rectal cancer Neg Hx    Esophageal cancer Neg Hx     Social History   Tobacco Use   Smoking status: Former    Types: Cigarettes    Start date: 1962    Quit date: 1978    Years since quitting: 45.1   Smokeless tobacco: Never   Tobacco comments:    1 pack would last pt 1 week  Vaping Use   Vaping Use: Never used  Substance Use Topics   Alcohol use: Yes  Comment: occ   Drug use: Never    Current Outpatient Medications  Medication Sig Dispense Refill   amLODipine (NORVASC) 5 MG tablet Take by mouth.     aspirin 81 MG EC tablet Take by mouth.     atorvastatin (LIPITOR) 10 MG tablet Take 1 tablet by mouth daily.     benzonatate (TESSALON) 100 MG capsule Take 100 mg by mouth 3 (three) times daily.     carvedilol (COREG) 6.25 MG tablet Take by mouth.     Cyanocobalamin (VITAMIN B12 PO) Take 1 tablet by mouth daily.     Dupilumab (DUPIXENT) 300 MG/2ML SOPN      famotidine (PEPCID) 20 MG tablet TAKE 1 TABLET BY MOUTH ONCE DAILY AT BEDTIME AS NEEDED FOR 90 DAYS     gabapentin (NEURONTIN) 100 MG capsule Take 100 mg by mouth daily.     Lancets (ONETOUCH DELICA PLUS [id number]) MISC USE TO CHECK YOUR  BLOOD SUGAR TO OBTAIN BLOOD ONCE DAILY     levocetirizine (XYZAL) 5 MG tablet Take by mouth.     losartan-hydrochlorothiazide (HYZAAR) 100-12.5 MG tablet Take 1 tablet by mouth daily.     metFORMIN (GLUCOPHAGE-XR) 500 MG 24 hr tablet SMARTSIG:1 Tablet(s) By Mouth Every Evening     montelukast (SINGULAIR) 10 MG tablet Take 10 mg by mouth daily.     Multiple Vitamin (MULTIVITAMIN) tablet Take 1 tablet by mouth daily.     ONETOUCH VERIO test strip SMARTSIG:Strip(s) Via Meter Daily     pantoprazole (PROTONIX) 40 MG tablet Take 40 mg by mouth 2 (two) times daily.     Probiotic Product (PROBIOTIC DAILY) CAPS Take 1 capsule by mouth daily.     vitamin C (ASCORBIC ACID) 500 MG tablet Take 500 mg by mouth daily.     No current facility-administered medications for this visit.    Allergies  Allergen Reactions   Amoxicillin-Pot Clavulanate Swelling   Tizanidine Swelling    Review of Systems:  Constitutional: Denies fever, chills, diaphoresis, appetite change and fatigue.  HEENT: Denies photophobia, eye pain, redness, hearing loss, ear pain, congestion, sore throat, rhinorrhea, sneezing, mouth sores, neck pain, neck stiffness and tinnitus.   Respiratory: Denies SOB, DOE, cough, chest tightness,  and wheezing.   Cardiovascular: Denies chest pain, palpitations and leg swelling.  Genitourinary: Denies dysuria, urgency, frequency, hematuria, flank pain and difficulty urinating.  Musculoskeletal: Denies myalgias, back pain, joint swelling, arthralgias and gait problem.  Skin: No rash.  Neurological: Denies dizziness, seizures, syncope, weakness, light-headedness, numbness and headaches.  Hematological: Denies adenopathy. Easy bruising, personal or family bleeding history  Psychiatric/Behavioral: No anxiety or depression     Physical Exam:    BP 140/74    Pulse 83    Ht 5\' 10"  (1.778 m)    Wt 172 lb (78 kg)    BMI 24.68 kg/m  Wt Readings from Last 3 Encounters:  01/26/22 172 lb (78 kg)   12/07/21 173 lb (78.5 kg)  11/04/21 172 lb 3.2 oz (78.1 kg)   Constitutional:  Well-developed, in no acute distress. Psychiatric: Normal mood and affect. Behavior is normal. HEENT: Pupils normal.  Conjunctivae are normal. No scleral icterus. Neck supple.  Cardiovascular: Normal rate, regular rhythm. No edema Pulmonary/chest: Effort normal and breath sounds normal. No wheezing, rales or rhonchi. Abdominal: Soft, nondistended. Nontender. Bowel sounds active throughout. There are no masses palpable. No hepatomegaly. Rectal: Deferred Neurological: Alert and oriented to person place and time. Skin: Skin is warm and dry. No rashes noted.  Data Reviewed: I have personally reviewed following labs and imaging studies  No flowsheet data found.     Carmell Austria, MD 01/26/2022, 1:38 PM  Cc: Johna Roles, PA

## 2022-02-04 ENCOUNTER — Ambulatory Visit: Payer: Medicare Other | Admitting: Pulmonary Disease

## 2022-02-09 ENCOUNTER — Other Ambulatory Visit: Payer: Self-pay

## 2022-02-09 ENCOUNTER — Ambulatory Visit (HOSPITAL_COMMUNITY)
Admission: RE | Admit: 2022-02-09 | Discharge: 2022-02-09 | Disposition: A | Discharge status: Home / Self Care | Payer: Medicare Other | Source: Ambulatory Visit | Attending: Gastroenterology | Admitting: Gastroenterology

## 2022-02-09 DIAGNOSIS — R1319 Other dysphagia: Secondary | ICD-10-CM

## 2022-02-09 DIAGNOSIS — K219 Gastro-esophageal reflux disease without esophagitis: Secondary | ICD-10-CM | POA: Diagnosis present

## 2022-02-09 DIAGNOSIS — K449 Diaphragmatic hernia without obstruction or gangrene: Secondary | ICD-10-CM

## 2022-03-01 ENCOUNTER — Encounter: Payer: Self-pay | Admitting: Gastroenterology

## 2022-03-01 ENCOUNTER — Ambulatory Visit (AMBULATORY_SURGERY_CENTER): Payer: Medicare Other | Admitting: Gastroenterology

## 2022-03-01 VITALS — BP 113/60 | HR 54 | Temp 96.9°F | Resp 9 | Ht 70.0 in | Wt 172.0 lb

## 2022-03-01 DIAGNOSIS — R1310 Dysphagia, unspecified: Secondary | ICD-10-CM

## 2022-03-01 DIAGNOSIS — K2960 Other gastritis without bleeding: Secondary | ICD-10-CM

## 2022-03-01 DIAGNOSIS — K222 Esophageal obstruction: Secondary | ICD-10-CM | POA: Diagnosis not present

## 2022-03-01 DIAGNOSIS — K229 Disease of esophagus, unspecified: Secondary | ICD-10-CM | POA: Diagnosis not present

## 2022-03-01 DIAGNOSIS — K2950 Unspecified chronic gastritis without bleeding: Secondary | ICD-10-CM | POA: Diagnosis not present

## 2022-03-01 DIAGNOSIS — K449 Diaphragmatic hernia without obstruction or gangrene: Secondary | ICD-10-CM | POA: Diagnosis not present

## 2022-03-01 DIAGNOSIS — K219 Gastro-esophageal reflux disease without esophagitis: Secondary | ICD-10-CM

## 2022-03-01 MED ORDER — SODIUM CHLORIDE 0.9 % IV SOLN: 500.0000 mL | Freq: Once | INTRAVENOUS | Status: DC

## 2022-03-01 NOTE — Progress Notes (Signed)
? ? ?Chief Complaint: FU ? ?Referring Provider:  Johna Roles, PA    ? ? ?ASSESSMENT AND PLAN;  ? ?#1. GERD with eso dysphagia  ? ?#2. Cough ?  Etiology. Neg pulm/ENT eval/did not get better after stopping losartan. ? ? ?Plan: ?-Protonix '40mg'$  po BID, pepcid '20mg'$  po QHS to continue as already being done. ?-Ba swallow with barium tablet (R/O Zenker's) ?-EGD with dil ? ?Proceed with EGD. I have discussed the risks and benefits. The risks including rare risk of perforation, bleeding, missed UGI neoplasms, risks of anesthesia/sedation. Alternatives were given. Patient is aware and agrees to proceed. All the questions were answered. This will be scheduled in upcoming days. Consent forms were given for review. ? ? ?HPI:   ? ?Dale Fox is a 76 y.o. male  ?With DM2, HTN, HLD, OA, neuropathy ? ?C/O cough x 15 yrs,  ?Starts off like tickle, then paroxysmal dry cough which happens even in sleep. ?Had extensive neg ENT eval, pulmonary eval ?Thought to be d/t GERD.  Has been on Protonix 40 BID and Pepcid 20 QHS ? ?Continues to have cough. ? ?He stopped losartan x over 2 weeks without any relief from cough ? ?Over the last few weeks, having problems with solid food dysphagia-food getting hung up in the lower chest.  He denies having any heartburn on twice daily Protonix and on Pepcid at bedtime.  No odynophagia.  No problems with liquids. ? ? ?He denies having any regurgitation, N/V.  No diarrhea or constipation. ? ?Very frustrated with cough.  So is his wife. ? ?Wt Readings from Last 3 Encounters:  ?03/01/22 172 lb (78 kg)  ?01/26/22 172 lb (78 kg)  ?12/07/21 173 lb (78.5 kg)  ? ? ? ?Previous GI work-up: ?Previous EGD report and pictures-from [facility] 04/08/2020 ?-Normal esophagus (biopsed) ?-Low-grade narrowing Schatzki's ring s/p balloon dilatation - NO further dysphagia. ?-Normal stomach (biopsed) ?-Normal duodenum ?We do not have biopsy reports yet.  Apparently patient was told that he had  "Barrett's esophagus". ? ?Colon 01/2021 [facility]): neg except polyps. Rpt  3 yrs. ? ?Past Medical History:  ?Diagnosis Date  ? Barrett's esophagus 2021  ? Cancer [facility]) 2003  ? prostate  ? Chronic GERD   ? Diabetes ([place])   ? High blood pressure   ? High cholesterol   ? Neuropathy   ? Osteoarthritis   ? ? ?Past Surgical History:  ?Procedure Laterality Date  ? BACK SURGERY  2018  ? COLONOSCOPY  03/12/2021  ? [facility], Dr. Wilford Corner. One 3 mm polyp in the descending colon, removed with a cold biopsy forceps. Resected and retrived. Diverticulosis in the sigmoid colon. Internal hemorrhoids. The examined portion of the ileum was normal.  ? HERNIA REPAIR  2003  ? ROTATOR CUFF REPAIR Right   ? 2019  ? TURBINATE RESECTION    ? ? ?Family History  ?Problem Relation Age of Onset  ? Heart disease Mother   ? Colon cancer Neg Hx   ? Rectal cancer Neg Hx   ? Esophageal cancer Neg Hx   ? Stomach cancer Neg Hx   ? ? ?Social History  ? ?Tobacco Use  ? Smoking status: Former  ?  Types: Cigarettes  ?  Start date: 1962  ?  Quit date: 20  ?  Years since quitting: 45.2  ? Smokeless tobacco: Never  ? Tobacco comments:  ?  1 pack would last pt 1 week  ?Vaping Use  ? Vaping Use:  Never used  ?Substance Use Topics  ? Alcohol use: Yes  ?  Comment: occ  ? Drug use: Never  ? ? ?Current Outpatient Medications  ?Medication Sig Dispense Refill  ? amLODipine (NORVASC) 5 MG tablet Take by mouth.    ? aspirin 81 MG EC tablet Take by mouth.    ? atorvastatin (LIPITOR) 10 MG tablet Take 1 tablet by mouth daily.    ? benzonatate (TESSALON) 100 MG capsule Take 100 mg by mouth 3 (three) times daily.    ? carvedilol (COREG) 6.25 MG tablet Take by mouth.    ? Cyanocobalamin (VITAMIN B12 PO) Take 1 tablet by mouth daily.    ? Dupilumab (DUPIXENT) 300 MG/2ML SOPN     ? famotidine (PEPCID) 20 MG tablet TAKE 1 TABLET BY MOUTH ONCE DAILY AT BEDTIME AS NEEDED FOR 90 DAYS    ? gabapentin (NEURONTIN) 100 MG capsule Take 100 mg by mouth daily.    ?  Lancets (ONETOUCH DELICA PLUS [id number]) MISC USE TO CHECK YOUR BLOOD SUGAR TO OBTAIN BLOOD ONCE DAILY    ? levocetirizine (XYZAL) 5 MG tablet Take by mouth.    ? losartan-hydrochlorothiazide (HYZAAR) 100-12.5 MG tablet Take 1 tablet by mouth daily.    ? metFORMIN (GLUCOPHAGE-XR) 500 MG 24 hr tablet SMARTSIG:1 Tablet(s) By Mouth Every Evening    ? montelukast (SINGULAIR) 10 MG tablet Take 10 mg by mouth daily.    ? Multiple Vitamin (MULTIVITAMIN) tablet Take 1 tablet by mouth daily.    ? ONETOUCH VERIO test strip SMARTSIG:Strip(s) Via Meter Daily    ? pantoprazole (PROTONIX) 40 MG tablet Take 40 mg by mouth 2 (two) times daily.    ? Probiotic Product (PROBIOTIC DAILY) CAPS Take 1 capsule by mouth daily.    ? vitamin C (ASCORBIC ACID) 500 MG tablet Take 500 mg by mouth daily.    ? ?Current Facility-Administered Medications  ?Medication Dose Route Frequency Provider Last Rate Last Admin  ? 0.9 %  sodium chloride infusion  500 mL Intravenous Once Jackquline Denmark, MD      ? ? ?Allergies  ?Allergen Reactions  ? Amoxicillin-Pot Clavulanate Swelling  ? Tizanidine Swelling  ? ? ?Review of Systems:  ?Constitutional: Denies fever, chills, diaphoresis, appetite change and fatigue.  ?HEENT: Denies photophobia, eye pain, redness, hearing loss, ear pain, congestion, sore throat, rhinorrhea, sneezing, mouth sores, neck pain, neck stiffness and tinnitus.   ?Respiratory: Denies SOB, DOE, cough, chest tightness,  and wheezing.   ?Cardiovascular: Denies chest pain, palpitations and leg swelling.  ?Genitourinary: Denies dysuria, urgency, frequency, hematuria, flank pain and difficulty urinating.  ?Musculoskeletal: Denies myalgias, back pain, joint swelling, arthralgias and gait problem.  ?Skin: No rash.  ?Neurological: Denies dizziness, seizures, syncope, weakness, light-headedness, numbness and headaches.  ?Hematological: Denies adenopathy. Easy bruising, personal or family bleeding history  ?Psychiatric/Behavioral: No anxiety or  depression ? ?  ? ?Physical Exam:   ? ?BP (!) 133/57   Pulse 63   Temp (!) 96.9 ?F (36.1 ?C) (Temporal)   Ht '5\' 10"'$  (1.778 m)   Wt 172 lb (78 kg)   SpO2 98%   BMI 24.68 kg/m?  ?Wt Readings from Last 3 Encounters:  ?03/01/22 172 lb (78 kg)  ?01/26/22 172 lb (78 kg)  ?12/07/21 173 lb (78.5 kg)  ? ?Constitutional:  Well-developed, in no acute distress. ?Psychiatric: Normal mood and affect. Behavior is normal. ?HEENT: Pupils normal.  Conjunctivae are normal. No scleral icterus. ?Neck supple.  ?Cardiovascular: Normal rate, regular rhythm. No edema ?  Pulmonary/chest: Effort normal and breath sounds normal. No wheezing, rales or rhonchi. ?Abdominal: Soft, nondistended. Nontender. Bowel sounds active throughout. There are no masses palpable. No hepatomegaly. ?Rectal: Deferred ?Neurological: Alert and oriented to person place and time. ?Skin: Skin is warm and dry. No rashes noted. ? ?Data Reviewed: I have personally reviewed following labs and imaging studies ? ?No flowsheet data found. ? ? ? ? ?Carmell Austria, MD 03/01/2022, 10:01 AM ? ?Cc: Johna Roles, PA ? ? ?

## 2022-03-01 NOTE — Progress Notes (Signed)
Called to room to assist during endoscopic procedure.  Patient ID and intended procedure confirmed with present staff. Received instructions for my participation in the procedure from the performing physician.  

## 2022-03-01 NOTE — Patient Instructions (Signed)
Handouts Provided:  Post Dilation Diet ? ?YOU HAD AN ENDOSCOPIC PROCEDURE TODAY AT THE [facility]:   Refer to the procedure report that was given to you for any specific questions about what was found during the examination.  If the procedure report does not answer your questions, please call your gastroenterologist to clarify.  If you requested that your care partner not be given the details of your procedure findings, then the procedure report has been included in a sealed envelope for you to review at your convenience later. ? ?YOU SHOULD EXPECT: Some feelings of bloating in the abdomen. Passage of more gas than usual.  Walking can help get rid of the air that was put into your GI tract during the procedure and reduce the bloating. If you had a lower endoscopy (such as a colonoscopy or flexible sigmoidoscopy) you may notice spotting of blood in your stool or on the toilet paper. If you underwent a bowel prep for your procedure, you may not have a normal bowel movement for a few days. ? ?Please Note:  You might notice some irritation and congestion in your nose or some drainage.  This is from the oxygen used during your procedure.  There is no need for concern and it should clear up in a day or so. ? ?SYMPTOMS TO REPORT IMMEDIATELY: ? ?Following upper endoscopy (EGD) ? Vomiting of blood or coffee ground material ? New chest pain or pain under the shoulder blades ? Painful or persistently difficult swallowing ? New shortness of breath ? Fever of 100?F or higher ? Black, tarry-looking stools ? ?For urgent or emergent issues, a gastroenterologist can be reached at any hour by calling [phone]. ?Do not use MyChart messaging for urgent concerns.  ? ? ?DIET:  See Post Dilation Diet provided.  Drink plenty of fluids but you should avoid alcoholic beverages for 24 hours. ? ?ACTIVITY:  You should plan to take it easy for the rest of today and you should NOT DRIVE or use heavy machinery until tomorrow  (because of the sedation medicines used during the test).   ? ?FOLLOW UP: ?Our staff will call the number listed on your records 48-72 hours following your procedure to check on you and address any questions or concerns that you may have regarding the information given to you following your procedure. If we do not reach you, we will leave a message.  We will attempt to reach you two times.  During this call, we will ask if you have developed any symptoms of COVID 19. If you develop any symptoms (ie: fever, flu-like symptoms, shortness of breath, cough etc.) before then, please call [phone].  If you test positive for Covid 19 in the 2 weeks post procedure, please call and report this information to us.   ? ?If any biopsies were taken you will be contacted by phone or by letter within the next 1-3 weeks.  Please call us at [phone] if you have not heard about the biopsies in 3 weeks.  ? ? ?SIGNATURES/CONFIDENTIALITY: ?You and/or your care partner have signed paperwork which will be entered into your electronic medical record.  These signatures attest to the fact that that the information above on your After Visit Summary has been reviewed and is understood.  Full responsibility of the confidentiality of this discharge information lies with you and/or your care-partner. ? ?

## 2022-03-01 NOTE — Op Note (Signed)
[facility] ?Patient Name: Dale Fox ?Procedure Date: 03/01/2022 10:01 AM ?MRN: [id number] ?Endoscopist: Jackquline Denmark , MD ?Age: 76 ?Referring MD:  ?Date of Birth: 1946/11/23 ?Gender: Male ?Account #: [id number] ?Procedure:                Upper GI endoscopy ?Indications:              Dysphagia ?Medicines:                Monitored Anesthesia Care ?Procedure:                Pre-Anesthesia Assessment: ?                          - Prior to the procedure, a History and Physical  ?                          was performed, and patient medications and  ?                          allergies were reviewed. The patient's tolerance of  ?                          previous anesthesia was also reviewed. The risks  ?                          and benefits of the procedure and the sedation  ?                          options and risks were discussed with the patient.  ?                          All questions were answered, and informed consent  ?                          was obtained. Prior Anticoagulants: The patient has  ?                          taken no previous anticoagulant or antiplatelet  ?                          agents. ASA Grade Assessment: II - A patient with  ?                          mild systemic disease. After reviewing the risks  ?                          and benefits, the patient was deemed in  ?                          satisfactory condition to undergo the procedure. ?                          After obtaining informed consent, the endoscope was  ?  passed under direct vision. Throughout the  ?                          procedure, the patient's blood pressure, pulse, and  ?                          oxygen saturations were monitored continuously. The  ?                          GIF HQ190 #[id number] was introduced through the  ?                          mouth, and advanced to the second part of duodenum.  ?                          The upper GI endoscopy was accomplished  without  ?                          difficulty. The patient tolerated the procedure  ?                          well. ?Scope In: ?Scope Out: ?Findings:                 The examined esophagus was mildly tortuous.  ?                          Biopsies were obtained from the proximal and distal  ?                          esophagus with cold forceps for histology tor r/o  ?                          eosinophilic esophagitis. ?                          A non-obstructing and mild Schatzki ring was found  ?                          at the gastroesophageal junction, 40 cm from the  ?                          incisors. The scope was withdrawn. Dilation was  ?                          performed with a Maloney dilator with mild  ?                          resistance at 52 Fr. ?                          A 2 cm hiatal hernia was present. ?                          Localized mild inflammation characterized by  ?  erythema was found in the gastric antrum. Biopsies  ?                          were taken with a cold forceps for histology. ?                          The examined duodenum was normal. A benign  ?                          stricture was noted at the junction of first and  ?                          second portion of the duodenum which did allow GIF  ?                          H190. ?Complications:            No immediate complications. ?Estimated Blood Loss:     Estimated blood loss: none. ?Impression:               - Presbyesophagus s/p dilatation. ?                          - Non-obstructing and mild Schatzki ring. Dilated. ?                          - 2 cm hiatal hernia. ?                          - Gastritis. Biopsied. ?Recommendation:           - Patient has a contact number available for  ?                          emergencies. The signs and symptoms of potential  ?                          delayed complications were discussed with the  ?                          patient. Return to normal  activities tomorrow.  ?                          Written discharge instructions were provided to the  ?                          patient. ?                          - Post dilatation diet. ?                          - Continue present medications. ?                          - Await pathology results. ?                          -  Chew foods specially meats and breads well and  ?                          eat slowly. ?                          - The findings and recommendations were discussed  ?                          with the patient's family. ?Jackquline Denmark, MD ?03/01/2022 10:19:49 AM ?This report has been signed electronically. ?

## 2022-03-01 NOTE — Progress Notes (Signed)
To pacu, VSS. Report to [email or web address] 

## 2022-03-03 ENCOUNTER — Telehealth: Payer: Self-pay

## 2022-03-03 NOTE — Telephone Encounter (Signed)
?  Follow up Call- ? ?Call back number 03/01/2022  ?Post procedure Call Back phone  # [phone]  ?Permission to leave phone message Yes  ?  ? ?Patient questions: ? ?Do you have a fever, pain , or abdominal swelling? No. ?Pain Score  0 * ? ?Have you tolerated food without any problems? Yes.   ? ?Have you been able to return to your normal activities? Yes.   ? ?Do you have any questions about your discharge instructions: ?Diet   No. ?Medications  No. ?Follow up visit  No. ? ?Do you have questions or concerns about your Care? No. ? ?Actions: ?* If pain score is 4 or above: ?No action needed, pain <4. ? ? ?

## 2022-03-04 ENCOUNTER — Encounter: Payer: Self-pay | Admitting: Gastroenterology

## 2022-03-17 ENCOUNTER — Encounter: Payer: Self-pay | Admitting: *Deleted

## 2022-09-18 IMAGING — CR DG PELVIS 1-2V
1 series · 1 of 1 positions shown · non-contrast
Comparison: None.

CLINICAL DATA: Neck pain.

EXAM:
PELVIS - 1-2 VIEW

[w pelvis *]
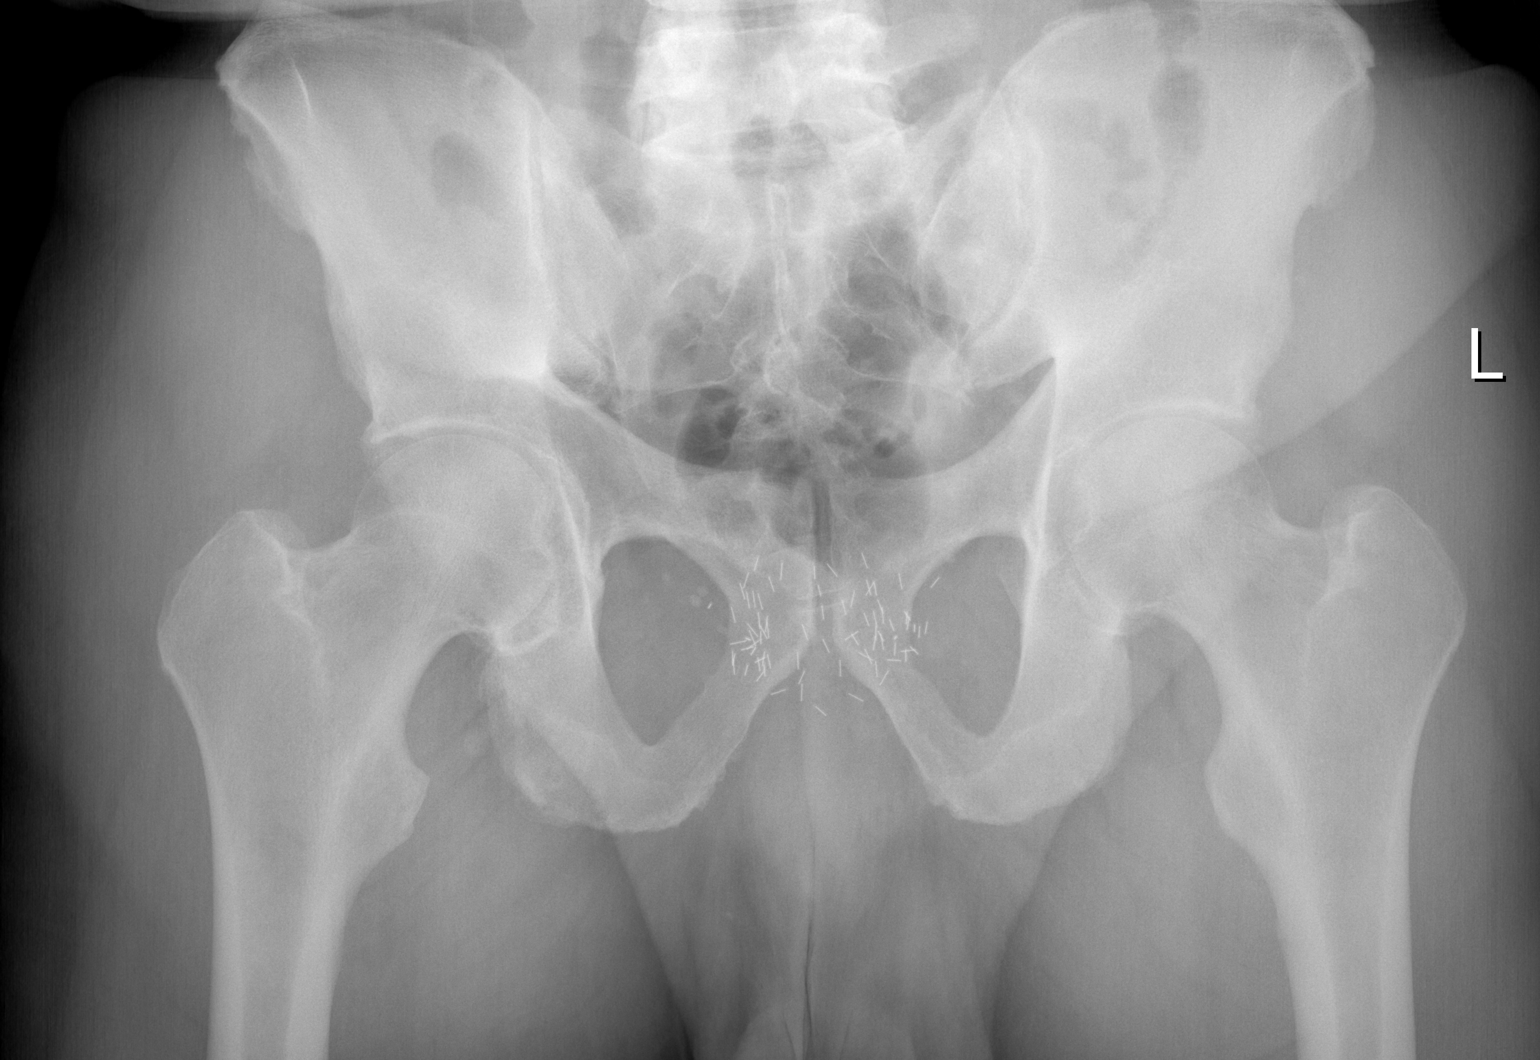

[1 of 1 positions shown; findings below may reference images not displayed]

FINDINGS: There is no evidence of pelvic fracture or diastasis. No pelvic bone
lesions are seen. Prostate radiotherapy seeds are present. There are
mild degenerative changes of both hips with joint space narrowing
and osteophyte formation.
IMPRESSION: 1. No acute bony abnormality.
2. Mild degenerative changes of the hips.

## 2023-01-21 ENCOUNTER — Ambulatory Visit
Admission: RE | Admit: 2023-01-21 | Discharge: 2023-01-21 | Disposition: A | Discharge status: Home / Self Care | Payer: Medicare Other | Source: Ambulatory Visit | Attending: Physician Assistant | Admitting: Physician Assistant

## 2023-01-21 ENCOUNTER — Other Ambulatory Visit: Payer: Self-pay | Admitting: Physician Assistant

## 2023-01-21 DIAGNOSIS — Z9889 Other specified postprocedural states: Secondary | ICD-10-CM

## 2023-01-21 DIAGNOSIS — R0789 Other chest pain: Secondary | ICD-10-CM

## 2023-06-03 ENCOUNTER — Emergency Department (HOSPITAL_COMMUNITY)
Admission: EM | Admit: 2023-06-03 | Discharge: 2023-06-03 | Disposition: A | Discharge status: Home / Self Care | Payer: Medicare Other | Attending: Emergency Medicine | Admitting: Emergency Medicine

## 2023-06-03 ENCOUNTER — Other Ambulatory Visit: Payer: Self-pay

## 2023-06-03 ENCOUNTER — Emergency Department (HOSPITAL_COMMUNITY): Payer: Medicare Other

## 2023-06-03 ENCOUNTER — Encounter (HOSPITAL_COMMUNITY): Payer: Self-pay

## 2023-06-03 DIAGNOSIS — E860 Dehydration: Secondary | ICD-10-CM | POA: Diagnosis not present

## 2023-06-03 DIAGNOSIS — E119 Type 2 diabetes mellitus without complications: Secondary | ICD-10-CM | POA: Insufficient documentation

## 2023-06-03 DIAGNOSIS — R55 Syncope and collapse: Secondary | ICD-10-CM | POA: Diagnosis present

## 2023-06-03 DIAGNOSIS — Z7982 Long term (current) use of aspirin: Secondary | ICD-10-CM | POA: Insufficient documentation

## 2023-06-03 DIAGNOSIS — Z8546 Personal history of malignant neoplasm of prostate: Secondary | ICD-10-CM | POA: Diagnosis not present

## 2023-06-03 DIAGNOSIS — I10 Essential (primary) hypertension: Secondary | ICD-10-CM | POA: Insufficient documentation

## 2023-06-03 LAB — CBC WITH DIFFERENTIAL/PLATELET
Abs Immature Granulocytes: 0.01 10*3/uL (ref 0.00–0.07)
Basophils Absolute: 0 10*3/uL (ref 0.0–0.1)
Basophils Relative: 0 %
Eosinophils Absolute: 0 10*3/uL (ref 0.0–0.5)
Eosinophils Relative: 0 %
HCT: 42.3 % (ref 39.0–52.0)
Hemoglobin: 14.1 g/dL (ref 13.0–17.0)
Immature Granulocytes: 0 %
Lymphocytes Relative: 19 %
Lymphs Abs: 0.9 10*3/uL (ref 0.7–4.0)
MCH: 30.5 pg (ref 26.0–34.0)
MCHC: 33.3 g/dL (ref 30.0–36.0)
MCV: 91.4 fL (ref 80.0–100.0)
Monocytes Absolute: 0.4 10*3/uL (ref 0.1–1.0)
Monocytes Relative: 8 %
Neutro Abs: 3.3 10*3/uL (ref 1.7–7.7)
Neutrophils Relative %: 73 %
Platelets: 163 10*3/uL (ref 150–400)
RBC: 4.63 MIL/uL (ref 4.22–5.81)
RDW: 13.8 % (ref 11.5–15.5)
WBC: 4.6 10*3/uL (ref 4.0–10.5)
nRBC: 0 % (ref 0.0–0.2)

## 2023-06-03 LAB — COMPREHENSIVE METABOLIC PANEL
ALT: 28 U/L (ref 0–44)
AST: 24 U/L (ref 15–41)
Albumin: 3.8 g/dL (ref 3.5–5.0)
Alkaline Phosphatase: 48 U/L (ref 38–126)
Anion gap: 11 (ref 5–15)
BUN: 21 mg/dL (ref 8–23)
CO2: 31 mmol/L (ref 22–32)
Calcium: 9 mg/dL (ref 8.9–10.3)
Chloride: 100 mmol/L (ref 98–111)
Creatinine, Ser: 1.52 mg/dL — ABNORMAL HIGH (ref 0.61–1.24)
GFR, Estimated: 47 mL/min — ABNORMAL LOW (ref >60)
Glucose, Bld: 206 mg/dL — ABNORMAL HIGH (ref 70–99)
Potassium: 3.6 mmol/L (ref 3.5–5.1)
Sodium: 142 mmol/L (ref 135–145)
Total Bilirubin: 1.1 mg/dL (ref 0.3–1.2)
Total Protein: 7 g/dL (ref 6.5–8.1)

## 2023-06-03 MED ORDER — SODIUM CHLORIDE 0.9 % IV SOLN: INTRAVENOUS | Status: DC

## 2023-06-03 MED ORDER — SODIUM CHLORIDE 0.9 % IV BOLUS
1000.0000 mL | Freq: Once | INTRAVENOUS | Status: AC
  Administered 2023-06-03: 1000 mL via INTRAVENOUS

## 2023-06-03 NOTE — Discharge Instructions (Signed)
Your lab test did show that you are dehydrated.  Drink plenty of fluids.  Follow-up with your doctor next week to be rechecked.  Return to the ED for any.

## 2023-06-03 NOTE — ED Provider Notes (Signed)
[facility] Provider Note   CSN: [id number] Arrival date & time: 06/03/23  1541     History  Chief Complaint  Patient presents with   Near Syncope    Dale Fox is a 77 y.o. male.   Near Syncope   Patient has a history of diabetes hypercholesterolemia, hypertension, neuropathy, prostate cancer, Barrett's esophagus.  Patient presents to the ED for evaluation of lightheadedness syncopal episode.  Patient recently traveled to [place].  He subsequently started having some cough congestion.  He was diagnosed with COVID within the last week and started taking Paxlovid.  Patient has had generalized weakness and malaise since then.  He has not been eating or drinking as well.  He is not having any chest pain or shortness of breath.  No leg swelling.  No fevers or chills.  This evening he started to feel lightheaded while eating and his wife helped him to the floor.  He had a syncopal episode.  Patient recovered without any confusion.  He went to an urgent care and they suggestEvaluation in the ED    Home Medications Prior to Admission medications   Medication Sig Start Date End Date Taking? Authorizing Provider  amLODipine (NORVASC) 5 MG tablet Take by mouth. 01/08/21   [provider]  aspirin 81 MG EC tablet Take by mouth.    [provider]  atorvastatin (LIPITOR) 10 MG tablet Take 1 tablet by mouth daily. 04/08/21   [provider]  benzonatate (TESSALON) 100 MG capsule Take 100 mg by mouth 3 (three) times daily. 11/27/21   [provider]  carvedilol (COREG) 6.25 MG tablet Take by mouth. 07/02/21   [provider]  Cyanocobalamin (VITAMIN B12 PO) Take 1 tablet by mouth daily.    [provider]  Dupilumab (DUPIXENT) 300 MG/2ML SOPN  03/09/21   [provider]  famotidine (PEPCID) 20 MG tablet TAKE 1 TABLET BY MOUTH ONCE DAILY AT BEDTIME AS NEEDED FOR 90 DAYS 01/03/21    [provider]  gabapentin (NEURONTIN) 100 MG capsule Take 100 mg by mouth daily. 04/30/21   [provider]  Lancets (ONETOUCH DELICA PLUS LANCET33G) MISC USE TO CHECK YOUR BLOOD SUGAR TO OBTAIN BLOOD ONCE DAILY 02/05/21   [provider]  levocetirizine (XYZAL) 5 MG tablet Take by mouth.    [provider]  losartan-hydrochlorothiazide (HYZAAR) 100-12.5 MG tablet Take 1 tablet by mouth daily. 04/03/21   [provider]  metFORMIN (GLUCOPHAGE-XR) 500 MG 24 hr tablet SMARTSIG:1 Tablet(s) By Mouth Every Evening 04/30/21   [provider]  montelukast (SINGULAIR) 10 MG tablet Take 10 mg by mouth daily. 11/10/21   [provider]  Multiple Vitamin (MULTIVITAMIN) tablet Take 1 tablet by mouth daily.    [provider]  Lum Babe test strip SMARTSIG:Strip(s) Via Meter Daily 10/01/21   [provider]  pantoprazole (PROTONIX) 40 MG tablet Take 40 mg by mouth 2 (two) times daily. 11/20/21   [provider]  Probiotic Product (PROBIOTIC DAILY) CAPS Take 1 capsule by mouth daily.    [provider]  vitamin C (ASCORBIC ACID) 500 MG tablet Take 500 mg by mouth daily.    [provider]      Allergies    Amoxicillin-pot clavulanate and Tizanidine    Review of Systems   Review of Systems  Cardiovascular:  Positive for near-syncope.    Physical Exam Updated Vital Signs BP (!) 167/77   Pulse Marland Kitchen)  58   Temp 97.7 F (36.5 C) (Oral)   Resp (!) 25   Ht 1.778 m (5\' 10" )   Wt 72.6 kg   SpO2 93%   BMI 22.96 kg/m  Physical Exam Vitals and nursing note reviewed.  Constitutional:      General: He is not in acute distress.    Appearance: He is well-developed.  HENT:     Head: Normocephalic and atraumatic.     Right Ear: External ear normal.     Left Ear: External ear normal.  Eyes:     General: No scleral icterus.       Right eye: No discharge.        Left eye: No discharge.      Conjunctiva/sclera: Conjunctivae normal.  Neck:     Trachea: No tracheal deviation.  Cardiovascular:     Rate and Rhythm: Normal rate and regular rhythm.  Pulmonary:     Effort: Pulmonary effort is normal. No respiratory distress.     Breath sounds: Normal breath sounds. No stridor. No wheezing or rales.  Abdominal:     General: Bowel sounds are normal. There is no distension.     Palpations: Abdomen is soft.     Tenderness: There is no abdominal tenderness. There is no guarding or rebound.  Musculoskeletal:        General: No tenderness or deformity.     Cervical back: Neck supple.  Skin:    General: Skin is warm and dry.     Findings: No rash.  Neurological:     General: No focal deficit present.     Mental Status: He is alert.     Cranial Nerves: No cranial nerve deficit, dysarthria or facial asymmetry.     Sensory: No sensory deficit.     Motor: No abnormal muscle tone or seizure activity.     Coordination: Coordination normal.  Psychiatric:        Mood and Affect: Mood normal.     ED Results / Procedures / Treatments   Labs (all labs ordered are listed, but only abnormal results are displayed) Labs Reviewed  COMPREHENSIVE METABOLIC PANEL - Abnormal; Notable for the following components:      Result Value   Glucose, Bld 206 (*)    Creatinine, Ser 1.52 (*)    GFR, Estimated 47 (*)    All other components within normal limits  CBC WITH DIFFERENTIAL/PLATELET    EKG EKG Interpretation  Date/Time:  Friday June 03 2023 15:54:05 EDT Ventricular Rate:  58 PR Interval:  199 QRS Duration: 104 QT Interval:  438 QTC Calculation: 431 R Axis:   51 Text Interpretation: Sinus rhythm Confirmed by Linwood Dibbles [phone]) on 06/03/2023 6:42:56 PM  Radiology DG Chest Port 1 View  Result Date: 06/03/2023 CLINICAL DATA:  Syncope EXAM: PORTABLE CHEST 1 VIEW COMPARISON:  Chest x-ray 09/18/2021 FINDINGS: No consolidation, pneumothorax or effusion. No edema. Normal cardiopericardial  silhouette. Degenerative changes of the spine. Overlapping cardiac leads. IMPRESSION: No acute cardiopulmonary disease. Electronically Signed   By: Karen Kays M.D.   On: 06/03/2023 16:54    Procedures Procedures    Medications Ordered in ED Medications  sodium chloride 0.9 % bolus 1,000 mL (0 mLs Intravenous Stopped 06/03/23 1803)    And  0.9 %  sodium chloride infusion ( Intravenous New Bag/Given 06/03/23 1803)    ED Course/ Medical Decision Making/ A&P Clinical Course as of 06/03/23 1916  Fri Jun 03, 2023  1842 CBC normal.  Metabolic  panel shows elevated creatinine 1.52 [JK]  1843 Blood pressure without orthostatic hypotension [JK]  1913 Reviewed the patient's outpatient laboratory test through his [facility] physician portal.  Creatinine normally around 1 [JK]    Clinical Course User Index [JK] Linwood Dibbles, MD                             Medical Decision Making Problems Addressed: Dehydration: acute illness or injury that poses a threat to life or bodily functions Syncope and collapse: acute illness or injury that poses a threat to life or bodily functions  Amount and/or Complexity of Data Reviewed Labs: ordered. Decision-making details documented in ED Course. Radiology: ordered and independent interpretation performed. ECG/medicine tests: ordered.  Risk Prescription drug management.    patient presented to ED for evaluation of syncopal episode.  This is in the setting of recent COVID diagnosis.  Patient has had generalized malaise and decreased p.o. intake.  ED workup does show evidence of dehydration with elevated creatinine.  No signs of anemia.  No cardiac dysrhythmia noted.  Patient's not having any chest pain or shortness of breath.  Doubt pulmonary embolism.  Patient was treated with IV fluids.  Orthostatic vital signs are now reassuring.  He is not feeling lightheaded or dizzy any longer.  Feel patient is appropriate for discharge and close outpatient follow-up as his  symptoms likely related to dehydration and decreased p.o. intake         Final Clinical Impression(s) / ED Diagnoses Final diagnoses:  Syncope and collapse  Dehydration    Rx / DC Orders ED Discharge Orders     None         Linwood Dibbles, MD 06/03/23 [facility] [place]

## 2023-06-03 NOTE — ED Triage Notes (Signed)
Patient brought in by EMS due to having a syncopal episode. Per report, patient passed out while eating and wife assisted patient to the floor. Pt was in [place] about 2 weeks ago and diagnosed with COVID upon his return. Has not been feeling well since, having episodes of dizziness and generalize weakness.

## 2023-11-02 ENCOUNTER — Ambulatory Visit
Admission: RE | Admit: 2023-11-02 | Discharge: 2023-11-02 | Disposition: A | Discharge status: Home / Self Care | Payer: Medicare Other | Source: Ambulatory Visit | Attending: Physician Assistant

## 2023-11-02 ENCOUNTER — Other Ambulatory Visit: Payer: Self-pay | Admitting: Physician Assistant

## 2023-11-02 DIAGNOSIS — R109 Unspecified abdominal pain: Secondary | ICD-10-CM

## 2023-11-02 DIAGNOSIS — M545 Low back pain, unspecified: Secondary | ICD-10-CM

## 2024-06-08 ENCOUNTER — Inpatient Hospital Stay: Attending: Hematology and Oncology

## 2024-06-08 ENCOUNTER — Inpatient Hospital Stay (HOSPITAL_BASED_OUTPATIENT_CLINIC_OR_DEPARTMENT_OTHER): Admitting: Hematology and Oncology

## 2024-06-08 VITALS — BP 134/64 | HR 58 | Temp 98.3°F | Resp 13 | Wt 168.4 lb

## 2024-06-08 DIAGNOSIS — Z7984 Long term (current) use of oral hypoglycemic drugs: Secondary | ICD-10-CM | POA: Diagnosis not present

## 2024-06-08 DIAGNOSIS — K219 Gastro-esophageal reflux disease without esophagitis: Secondary | ICD-10-CM | POA: Insufficient documentation

## 2024-06-08 DIAGNOSIS — D709 Neutropenia, unspecified: Secondary | ICD-10-CM

## 2024-06-08 DIAGNOSIS — D708 Other neutropenia: Secondary | ICD-10-CM

## 2024-06-08 DIAGNOSIS — Z8719 Personal history of other diseases of the digestive system: Secondary | ICD-10-CM | POA: Diagnosis not present

## 2024-06-08 DIAGNOSIS — Z8546 Personal history of malignant neoplasm of prostate: Secondary | ICD-10-CM | POA: Insufficient documentation

## 2024-06-08 DIAGNOSIS — I10 Essential (primary) hypertension: Secondary | ICD-10-CM | POA: Diagnosis not present

## 2024-06-08 DIAGNOSIS — E119 Type 2 diabetes mellitus without complications: Secondary | ICD-10-CM | POA: Diagnosis not present

## 2024-06-08 DIAGNOSIS — Z87891 Personal history of nicotine dependence: Secondary | ICD-10-CM | POA: Diagnosis not present

## 2024-06-08 DIAGNOSIS — Z79899 Other long term (current) drug therapy: Secondary | ICD-10-CM | POA: Insufficient documentation

## 2024-06-08 LAB — SEDIMENTATION RATE: Sed Rate: 4 mm/h (ref 0–16)

## 2024-06-08 LAB — CBC WITH DIFFERENTIAL (CANCER CENTER ONLY)
Abs Immature Granulocytes: 0.01 10*3/uL (ref 0.00–0.07)
Basophils Absolute: 0 10*3/uL (ref 0.0–0.1)
Basophils Relative: 1 %
Eosinophils Absolute: 0.2 10*3/uL (ref 0.0–0.5)
Eosinophils Relative: 4 %
HCT: 42.2 % (ref 39.0–52.0)
Hemoglobin: 14.3 g/dL (ref 13.0–17.0)
Immature Granulocytes: 0 %
Lymphocytes Relative: 27 %
Lymphs Abs: 1.1 10*3/uL (ref 0.7–4.0)
MCH: 30.6 pg (ref 26.0–34.0)
MCHC: 33.9 g/dL (ref 30.0–36.0)
MCV: 90.2 fL (ref 80.0–100.0)
Monocytes Absolute: 0.5 10*3/uL (ref 0.1–1.0)
Monocytes Relative: 11 %
Neutro Abs: 2.4 10*3/uL (ref 1.7–7.7)
Neutrophils Relative %: 57 %
Platelet Count: 194 10*3/uL (ref 150–400)
RBC: 4.68 MIL/uL (ref 4.22–5.81)
RDW: 15 % (ref 11.5–15.5)
WBC Count: 4.1 10*3/uL (ref 4.0–10.5)
nRBC: 0 % (ref 0.0–0.2)

## 2024-06-08 LAB — CMP (CANCER CENTER ONLY)
ALT: 24 U/L (ref 0–44)
AST: 22 U/L (ref 15–41)
Albumin: 4.2 g/dL (ref 3.5–5.0)
Alkaline Phosphatase: 57 U/L (ref 38–126)
Anion gap: 5 (ref 5–15)
BUN: 12 mg/dL (ref 8–23)
CO2: 33 mmol/L — ABNORMAL HIGH (ref 22–32)
Calcium: 9.5 mg/dL (ref 8.9–10.3)
Chloride: 105 mmol/L (ref 98–111)
Creatinine: 1.22 mg/dL (ref 0.61–1.24)
GFR, Estimated: >60 mL/min (ref >60)
Glucose, Bld: 101 mg/dL — ABNORMAL HIGH (ref 70–99)
Potassium: 4.3 mmol/L (ref 3.5–5.1)
Sodium: 143 mmol/L (ref 135–145)
Total Bilirubin: 0.8 mg/dL (ref 0.0–1.2)
Total Protein: 6.8 g/dL (ref 6.5–8.1)

## 2024-06-08 LAB — LACTATE DEHYDROGENASE: LDH: 159 U/L (ref 98–192)

## 2024-06-08 LAB — HEPATITIS B SURFACE ANTIBODY,QUALITATIVE: Hep B S Ab: REACTIVE — AB

## 2024-06-08 LAB — FOLATE: Folate: 26.1 ng/mL (ref >5.9)

## 2024-06-08 LAB — VITAMIN B12: Vitamin B-12: 1171 pg/mL — ABNORMAL HIGH (ref 180–914)

## 2024-06-08 LAB — HEPATITIS C ANTIBODY: HCV Ab: NONREACTIVE

## 2024-06-08 LAB — C-REACTIVE PROTEIN: CRP: 0.7 mg/dL (ref <1.0)

## 2024-06-08 LAB — HEPATITIS B SURFACE ANTIGEN: Hepatitis B Surface Ag: NONREACTIVE

## 2024-06-08 NOTE — Progress Notes (Unsigned)
 [facility] [phone]   [phone]  INITIAL CONSULT NOTE  Patient Care Team: Karalee Oscar, [place] as PCP - General (Internal Medicine)  Hematological/Oncological History # Leukopenia  06/03/2023: WBC 4.6 ,Hgb 14.1, MCV 91.4, Plt 163  02/14/2024: WBC 3.3, Hgb 14.6, MCV 92.3, Plt 186  05/22/2024: WBC 3.2, Hgb 14.1, MCV 89.6, Plt 191. ANC 1.4  06/08/2024: establish care with Dr. Rosaline Coma   CHIEF COMPLAINTS/PURPOSE OF CONSULTATION:  Leukopenia    HISTORY OF PRESENTING ILLNESS:  Dale Fox 78 y.o. male with medical history significant for Barrett's esophagus, prostate cancer, GERD, diabetes, and hypertension/hyperlipidemia who presents for evaluation of leukopenia.  On review of the previous records Dale Fox had labs collected on 06/03/2023 which showed white blood cell 4.6, hemoglobin 14.1, MCV 91.4, platelets 163.  Most recently on 05/22/2024 patient had white blood cells 3.2, hemoglobin 14.1, MCV 89.6, and platelets of 191 with an ANC of 1.4.  Due to concern for these findings the patient was referred to hematology for further evaluation management.  On exam today Dale Fox reports that he has no issues with recurrent infections such as pneumonia, urinary tract infections, or sinus infections.  He denies any runny nose, sore throat, or cough.  He reports that he does enjoy eating red meat and eats it approximately 2-3 times per week.  He notes he eats a good variety of other foods.  He has no history of liver disease and no inflammatory disorders.  He reports otherwise lately has been his baseline level of health.  On further discussion he reports that his mother died at age 9 and he is unsure of his father's history.  He reports that he has 2 healthy daughters both of whom are healthy.  He reports he is a former smoker having quit in 1972.  He reports he drinks about 2 drinks per day, with his drinks of choice being wine and vodka.  He  reports that he previously worked in Engineer, civil (consulting) and a Conservation officer, historic buildings.  On further review he reports his weight has been steady.  He does have some dryness and itching of his skin but will be seeing dermatology for this in the near future.  A full 10 point ROS is otherwise negative.  MEDICAL HISTORY:  Past Medical History:  Diagnosis Date   Barrett's esophagus 2021   Cancer [facility]) 2003   prostate   Chronic GERD    Diabetes (HCC)    High blood pressure    High cholesterol    Neuropathy    Osteoarthritis     SURGICAL HISTORY: Past Surgical History:  Procedure Laterality Date   BACK SURGERY  2018   COLONOSCOPY  03/12/2021   [facility], Dr. Baldo Bonds. One 3 mm polyp in the descending colon, removed with a cold biopsy forceps. Resected and retrived. Diverticulosis in the sigmoid colon. Internal hemorrhoids. The examined portion of the ileum was normal.   HERNIA REPAIR  2003   ROTATOR CUFF REPAIR Right    2019   TURBINATE RESECTION      SOCIAL HISTORY: Social History   Socioeconomic History   Marital status: Married    Spouse name: Not on file   Number of children: 2   Years of education: Not on file   Highest education level: Not on file  Occupational History   Occupation: Retired  Tobacco Use   Smoking status: Former    Current packs/day: 0.00    Types: Cigarettes    Start  date: 67    Quit date: 89    Years since quitting: 47.4   Smokeless tobacco: Never   Tobacco comments:    1 pack would last pt 1 week  Vaping Use   Vaping status: Never Used  Substance and Sexual Activity   Alcohol use: Yes    Comment: occ   Drug use: Never   Sexual activity: Not on file  Other Topics Concern   Not on file  Social History Narrative   Not on file   Social Drivers of Health   Financial Resource Strain: Low Risk  (03/23/2018)   Received from [facility]   Overall Financial Resource Strain (CARDIA)    Difficulty of Paying Living  Expenses: Not hard at all  Food Insecurity: No Food Insecurity (06/08/2024)   Hunger Vital Sign    Worried About Running Out of Food in the Last Year: Never true    Ran Out of Food in the Last Year: Never true  Transportation Needs: No Transportation Needs (06/08/2024)   PRAPARE - Administrator, Civil Service (Medical): No    Lack of Transportation (Non-Medical): No  Physical Activity: Sufficiently Active (07/17/2019)   Received from [facility]   Exercise Vital Sign    On average, how many days per week do you engage in moderate to strenuous exercise (like a brisk walk)?: 4 days    On average, how many minutes do you engage in exercise at this level?: 40 min  Stress: Stress Concern Present (07/17/2019)   Received from [facility] - Occupational Stress Questionnaire    Feeling of Stress : To some extent  Social Connections: Unknown (07/17/2019)   Received from [facility]   Social Connection and Isolation Panel    In a typical week, how many times do you talk on the phone with family, friends, or neighbors?: More than three times a week    How often do you get together with friends or relatives?: More than three times a week    Attends Religious Services: Not on file    Active Member of Clubs or Organizations: Not on file    Attends Banker Meetings: Not on file    Marital Status: Not on file  Intimate Partner Violence: Not At Risk (06/08/2024)   Humiliation, Afraid, Rape, and Kick questionnaire    Fear of Current or Ex-Partner: No    Emotionally Abused: No    Physically Abused: No    Sexually Abused: No    FAMILY HISTORY: Family History  Problem Relation Age of Onset   Heart disease Mother    Colon cancer Neg Hx    Rectal cancer Neg Hx    Esophageal cancer Neg Hx    Stomach cancer Neg Hx     ALLERGIES:  is allergic to amoxicillin-pot clavulanate and tizanidine.  MEDICATIONS:   Current Outpatient Medications  Medication Sig Dispense Refill   Olopatadine-Mometasone (RYALTRIS) 665-25 MCG/ACT SUSP 4 sprays (2 sprays in each nostril) Nasally Twice a day for 30 days     amLODipine (NORVASC) 5 MG tablet Take by mouth.     aspirin 81 MG EC tablet Take by mouth.     atorvastatin (LIPITOR) 10 MG tablet Take 1 tablet by mouth daily.     benzonatate (TESSALON) 100 MG capsule Take 100 mg by mouth 3 (three) times daily.     carvedilol (COREG) 6.25 MG tablet Take by mouth.  Cyanocobalamin (VITAMIN B12 PO) Take 1 tablet by mouth daily.     famotidine (PEPCID) 20 MG tablet TAKE 1 TABLET BY MOUTH ONCE DAILY AT BEDTIME AS NEEDED FOR 90 DAYS     gabapentin (NEURONTIN) 100 MG capsule Take 100 mg by mouth daily.     Lancets (ONETOUCH DELICA PLUS LANCET33G) MISC USE TO CHECK YOUR BLOOD SUGAR TO OBTAIN BLOOD ONCE DAILY     levocetirizine (XYZAL) 5 MG tablet Take by mouth.     losartan-hydrochlorothiazide (HYZAAR) 100-12.5 MG tablet Take 1 tablet by mouth daily.     metFORMIN (GLUCOPHAGE-XR) 500 MG 24 hr tablet SMARTSIG:1 Tablet(s) By Mouth Every Evening     montelukast (SINGULAIR) 10 MG tablet Take 10 mg by mouth daily.     Multiple Vitamin (MULTIVITAMIN) tablet Take 1 tablet by mouth daily.     ONETOUCH VERIO test strip SMARTSIG:Strip(s) Via Meter Daily     pantoprazole (PROTONIX) 40 MG tablet Take 40 mg by mouth 2 (two) times daily.     Probiotic Product (PROBIOTIC DAILY) CAPS Take 1 capsule by mouth daily.     vitamin C (ASCORBIC ACID) 500 MG tablet Take 500 mg by mouth daily.     No current facility-administered medications for this visit.    REVIEW OF SYSTEMS:   Constitutional: ( - ) fevers, ( - )  chills , ( - ) night sweats Eyes: ( - ) blurriness of vision, ( - ) double vision, ( - ) watery eyes Ears, nose, mouth, throat, and face: ( - ) mucositis, ( - ) sore throat Respiratory: ( - ) cough, ( - ) dyspnea, ( - ) wheezes Cardiovascular: ( - ) palpitation, ( - ) chest  discomfort, ( - ) lower extremity swelling Gastrointestinal:  ( - ) nausea, ( - ) heartburn, ( - ) change in bowel habits Skin: ( - ) abnormal skin rashes Lymphatics: ( - ) new lymphadenopathy, ( - ) easy bruising Neurological: ( - ) numbness, ( - ) tingling, ( - ) new weaknesses Behavioral/Psych: ( - ) mood change, ( - ) new changes  All other systems were reviewed with the patient and are negative.  PHYSICAL EXAMINATION:  Vitals:   06/08/24 1311  BP: 134/64  Pulse: (!) 58  Resp: 13  Temp: 98.3 F (36.8 C)  SpO2: 99%   Filed Weights   06/08/24 1311  Weight: 168 lb 6.4 oz (76.4 kg)    GENERAL: well appearing elderly African-American male in NAD  SKIN: skin color, texture, turgor are normal, no rashes or significant lesions EYES: conjunctiva are pink and non-injected, sclera clear LUNGS: clear to auscultation and percussion with normal breathing effort HEART: regular rate & rhythm and no murmurs and no lower extremity edema Musculoskeletal: no cyanosis of digits and no clubbing  PSYCH: alert & oriented x 3, fluent speech NEURO: no focal motor/sensory deficits  LABORATORY DATA:  I have reviewed the data as listed    Latest Ref Rng & Units 06/08/2024    2:00 PM 06/03/2023    4:16 PM  CBC  WBC 4.0 - 10.5 K/uL 4.1  4.6   Hemoglobin 13.0 - 17.0 g/dL 96.0  45.4   Hematocrit 39.0 - 52.0 % 42.2  42.3   Platelets 150 - 400 K/uL 194  163        Latest Ref Rng & Units 06/08/2024    2:00 PM 06/03/2023    4:16 PM  CMP  Glucose 70 - 99 mg/dL 098  206   BUN 8 - 23 mg/dL 12  21   Creatinine 9.60 - 1.24 mg/dL 4.54  0.98   Sodium 119 - 145 mmol/L 143  142   Potassium 3.5 - 5.1 mmol/L 4.3  3.6   Chloride 98 - 111 mmol/L 105  100   CO2 22 - 32 mmol/L 33  31   Calcium 8.9 - 10.3 mg/dL 9.5  9.0   Total Protein 6.5 - 8.1 g/dL 6.8  7.0   Total Bilirubin 0.0 - 1.2 mg/dL 0.8  1.1   Alkaline Phos 38 - 126 U/L 57  48   AST 15 - 41 U/L 22  24   ALT 0 - 44 U/L 24  28      ASSESSMENT &  PLAN Dale Fox 78 y.o. male with medical history significant for Barrett's esophagus, prostate cancer, GERD, diabetes, and hypertension/hyperlipidemia who presents for evaluation of leukopenia.  After review of the labs, review of the records, and discussion with the patient the patients findings are most consistent with leukopenia of unclear etiology  The differential for neutropenia includes nutritional deficiency, inflammatory disorder, medication side effect, infectious etiology, or congenital condition (such as benign ethnic neutropenia). The patient is not taking any medications known to cause neutropenia. Workup for this condition includes viral serologies (HIV, Hep B and Hep C),  vitamin b12/folate, and inflammatory markers with ESR and CRP.    A common cause for low WBC is benign ethnic neutropenia (BEN). This is a condition whereby individuals of African or Mediterranean descent have lower WBC than the general population. The condition typically has an absolute neutrophil count (ANC) between 1000-1500 with no recurrent infections. Patient with this condition have normal functioning immune systems and have no consequences as a result of their low ANC. BEN is a diagnosis of exclusion, so it would require the full above workup to make.    # Neutropenia, Unclear Etiology  --repeat CBC and CMP  --infectious serology testing with Hep B, Hep C, and HIV  --nutritional evaluation with Vitamin b12, folate  --inflammatory workup with ESR and CRP  --will r/o MM with SPEP and SFLC.  --RTC in 6 month's time or sooner if there is an issue with the above labs.    Orders Placed This Encounter  Procedures   CBC with Differential (Cancer Center Only)    Standing Status:   Future    Number of Occurrences:   1    Expiration Date:   06/08/2025   CMP (Cancer Center only)    Standing Status:   Future    Number of Occurrences:   1    Expiration Date:   06/08/2025   Lactate dehydrogenase (LDH)     Standing Status:   Future    Number of Occurrences:   1    Expiration Date:   06/08/2025   Vitamin B12    Standing Status:   Future    Number of Occurrences:   1    Expiration Date:   06/08/2025   Methylmalonic acid, serum    Standing Status:   Future    Number of Occurrences:   1    Expiration Date:   06/08/2025   Folate, Serum    Standing Status:   Future    Number of Occurrences:   1    Expiration Date:   06/08/2025   Multiple Myeloma Panel (SPEP&IFE w/QIG)    Standing Status:   Future    Number of Occurrences:  1    Expiration Date:   06/08/2025   Kappa/lambda light chains    Standing Status:   Future    Number of Occurrences:   1    Expiration Date:   06/08/2025   Sedimentation rate    Standing Status:   Future    Number of Occurrences:   1    Expiration Date:   06/08/2025   C-reactive protein    Standing Status:   Future    Number of Occurrences:   1    Expiration Date:   06/08/2025   Hepatitis C antibody    Standing Status:   Future    Number of Occurrences:   1    Expiration Date:   06/08/2025   Hepatitis B surface antigen    Standing Status:   Future    Number of Occurrences:   1    Expiration Date:   06/08/2025   Hepatitis B surface antibody    Standing Status:   Future    Number of Occurrences:   1    Expiration Date:   06/08/2025   Hepatitis B core antibody, total    Standing Status:   Future    Number of Occurrences:   1    Expiration Date:   06/08/2025    All questions were answered. The patient knows to call the clinic with any problems, questions or concerns.  A total of more than 60 minutes were spent on this encounter with face-to-face time and non-face-to-face time, including preparing to see the patient, ordering tests and/or medications, counseling the patient and coordination of care as outlined above.   Rogerio Clay, MD Department of Hematology/Oncology [facility] Phone: [phone] Pager:  [phone] Email: [email or web address]  06/10/2024 3:32 PM

## 2024-06-09 LAB — HEPATITIS B CORE ANTIBODY, TOTAL: HEP B CORE AB: NEGATIVE

## 2024-06-11 LAB — MULTIPLE MYELOMA PANEL, SERUM
Albumin SerPl Elph-Mcnc: 3.6 g/dL (ref 2.9–4.4)
Albumin/Glob SerPl: 1.3 (ref 0.7–1.7)
Alpha 1: 0.2 g/dL (ref 0.0–0.4)
Alpha2 Glob SerPl Elph-Mcnc: 0.7 g/dL (ref 0.4–1.0)
B-Globulin SerPl Elph-Mcnc: 1 g/dL (ref 0.7–1.3)
Gamma Glob SerPl Elph-Mcnc: 0.8 g/dL (ref 0.4–1.8)
Globulin, Total: 2.8 g/dL (ref 2.2–3.9)
IgA: 208 mg/dL (ref 61–437)
IgG (Immunoglobin G), Serum: 855 mg/dL (ref 603–1613)
IgM (Immunoglobulin M), Srm: 89 mg/dL (ref 15–143)
Total Protein ELP: 6.4 g/dL (ref 6.0–8.5)

## 2024-06-11 LAB — KAPPA/LAMBDA LIGHT CHAINS
Kappa free light chain: 21 mg/L — ABNORMAL HIGH (ref 3.3–19.4)
Kappa, lambda light chain ratio: 1.29 (ref 0.26–1.65)
Lambda free light chains: 16.3 mg/L (ref 5.7–26.3)

## 2024-06-12 LAB — METHYLMALONIC ACID, SERUM: Methylmalonic Acid, Quantitative: 102 nmol/L (ref 0–378)

## 2024-07-02 ENCOUNTER — Ambulatory Visit: Payer: Self-pay | Admitting: *Deleted

## 2024-07-02 NOTE — Telephone Encounter (Signed)
 TCT patient regarding recent lab results. No answer but was able to leave vm on his identified phone vm. Advised  that his labs showed his white blood cell counts returned to normal at 4.1.  Additionally are full evaluation with nutritional levels, viral levels, and bone marrow  assessment showed no concerning abnormalities.  At this time I would recommend routine follow-up with his primary care provider.  There is no need for routine follow-up in our clinic. Advised to call back with any questions or concerns.

## 2024-07-02 NOTE — Telephone Encounter (Signed)
-----   Message from Norleen ONEIDA Kidney IV sent at 06/29/2024  6:48 PM EDT ----- Please let Dale Fox know that his labs showed his white blood cell counts returned to normal at 4.1.  Additionally are full evaluation with nutritional levels, viral levels, and bone marrow  assessment showed no concerning abnormalities.  At this time I would recommend routine follow-up with his primary care provider.  There is no need for routine follow-up in our clinic. ----- Message ----- From: Interface, Lab In [place] Sent: 06/08/2024   2:19 PM EDT To: Norleen ONEIDA Kidney [place], MD

## 2024-11-19 ENCOUNTER — Ambulatory Visit (INDEPENDENT_AMBULATORY_CARE_PROVIDER_SITE_OTHER)

## 2024-11-19 ENCOUNTER — Encounter: Payer: Self-pay | Admitting: Podiatry

## 2024-11-19 ENCOUNTER — Ambulatory Visit: Admitting: Podiatry

## 2024-11-19 VITALS — Ht 70.0 in | Wt 168.0 lb

## 2024-11-19 DIAGNOSIS — M779 Enthesopathy, unspecified: Secondary | ICD-10-CM | POA: Diagnosis not present

## 2024-11-19 DIAGNOSIS — M7751 Other enthesopathy of right foot: Secondary | ICD-10-CM

## 2024-11-19 DIAGNOSIS — M2042 Other hammer toe(s) (acquired), left foot: Secondary | ICD-10-CM | POA: Diagnosis not present

## 2024-11-19 DIAGNOSIS — M7750 Other enthesopathy of unspecified foot: Secondary | ICD-10-CM | POA: Diagnosis not present

## 2024-11-19 DIAGNOSIS — M2041 Other hammer toe(s) (acquired), right foot: Secondary | ICD-10-CM

## 2024-11-19 MED ORDER — MELOXICAM 15 MG PO TABS
15.0000 mg | ORAL_TABLET | Freq: Every day | ORAL | 2 refills | Status: AC
Start: 2024-11-19 — End: ?

## 2024-11-20 NOTE — Progress Notes (Signed)
 Subjective:   Patient ID: Dale Fox, male   DOB: 78 y.o.   MRN: [id number]   HPI Patient presents stating that he has been getting chronic foot pain between the 3rd and 4th toes on the right foot and he had tried custom insoles he wants to discuss cortisone.  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  At this point we are going to continue meloxicam  and he can gradually reduce it and I discussed a regimen to do this and I wrote him for further usage.  He will use wider shoes if symptoms persist or if they were to worsen I want to see him right away     Plan:  Above list the plan

## 2024-12-03 ENCOUNTER — Encounter (HOSPITAL_BASED_OUTPATIENT_CLINIC_OR_DEPARTMENT_OTHER): Payer: Self-pay

## 2024-12-03 ENCOUNTER — Emergency Department (HOSPITAL_BASED_OUTPATIENT_CLINIC_OR_DEPARTMENT_OTHER)

## 2024-12-03 ENCOUNTER — Emergency Department (HOSPITAL_BASED_OUTPATIENT_CLINIC_OR_DEPARTMENT_OTHER)
Admission: EM | Admit: 2024-12-03 | Discharge: 2024-12-03 | Disposition: A | Discharge status: Home / Self Care | Source: Ambulatory Visit | Attending: Emergency Medicine | Admitting: Emergency Medicine

## 2024-12-03 ENCOUNTER — Other Ambulatory Visit: Payer: Self-pay

## 2024-12-03 DIAGNOSIS — R030 Elevated blood-pressure reading, without diagnosis of hypertension: Secondary | ICD-10-CM

## 2024-12-03 DIAGNOSIS — Z7984 Long term (current) use of oral hypoglycemic drugs: Secondary | ICD-10-CM | POA: Insufficient documentation

## 2024-12-03 DIAGNOSIS — K5792 Diverticulitis of intestine, part unspecified, without perforation or abscess without bleeding: Secondary | ICD-10-CM

## 2024-12-03 DIAGNOSIS — E119 Type 2 diabetes mellitus without complications: Secondary | ICD-10-CM | POA: Insufficient documentation

## 2024-12-03 DIAGNOSIS — Z7982 Long term (current) use of aspirin: Secondary | ICD-10-CM | POA: Insufficient documentation

## 2024-12-03 DIAGNOSIS — K5732 Diverticulitis of large intestine without perforation or abscess without bleeding: Secondary | ICD-10-CM | POA: Insufficient documentation

## 2024-12-03 DIAGNOSIS — Z79899 Other long term (current) drug therapy: Secondary | ICD-10-CM | POA: Insufficient documentation

## 2024-12-03 DIAGNOSIS — I10 Essential (primary) hypertension: Secondary | ICD-10-CM | POA: Insufficient documentation

## 2024-12-03 LAB — URINALYSIS, ROUTINE W REFLEX MICROSCOPIC
Bilirubin Urine: NEGATIVE
Glucose, UA: NEGATIVE mg/dL
Hgb urine dipstick: NEGATIVE
Ketones, ur: NEGATIVE mg/dL
Leukocytes,Ua: NEGATIVE
Nitrite: NEGATIVE
Specific Gravity, Urine: 1.023 (ref 1.005–1.030)
pH: 5.5 (ref 5.0–8.0)

## 2024-12-03 LAB — CBC
HCT: 43.7 % (ref 39.0–52.0)
Hemoglobin: 14.7 g/dL (ref 13.0–17.0)
MCH: 30.3 pg (ref 26.0–34.0)
MCHC: 33.6 g/dL (ref 30.0–36.0)
MCV: 90.1 fL (ref 80.0–100.0)
Platelets: 190 K/uL (ref 150–400)
RBC: 4.85 MIL/uL (ref 4.22–5.81)
RDW: 14.4 % (ref 11.5–15.5)
WBC: 6.4 K/uL (ref 4.0–10.5)
nRBC: 0 % (ref 0.0–0.2)

## 2024-12-03 LAB — COMPREHENSIVE METABOLIC PANEL WITH GFR
ALT: 24 U/L (ref 0–44)
AST: 24 U/L (ref 15–41)
Albumin: 4.4 g/dL (ref 3.5–5.0)
Alkaline Phosphatase: 71 U/L (ref 38–126)
Anion gap: 9 (ref 5–15)
BUN: 14 mg/dL (ref 8–23)
CO2: 30 mmol/L (ref 22–32)
Calcium: 10.1 mg/dL (ref 8.9–10.3)
Chloride: 106 mmol/L (ref 98–111)
Creatinine, Ser: 1.17 mg/dL (ref 0.61–1.24)
GFR, Estimated: >60 mL/min (ref >60)
Glucose, Bld: 93 mg/dL (ref 70–99)
Potassium: 3.9 mmol/L (ref 3.5–5.1)
Sodium: 145 mmol/L (ref 135–145)
Total Bilirubin: 0.7 mg/dL (ref 0.0–1.2)
Total Protein: 7.1 g/dL (ref 6.5–8.1)

## 2024-12-03 LAB — LIPASE, BLOOD: Lipase: 44 U/L (ref 11–51)

## 2024-12-03 MED ORDER — CIPROFLOXACIN HCL 500 MG PO TABS: 500.0000 mg | ORAL_TABLET | Freq: Two times a day (BID) | ORAL | 0 refills | Status: AC

## 2024-12-03 MED ORDER — IOHEXOL 300 MG/ML  SOLN
100.0000 mL | Freq: Once | INTRAMUSCULAR | Status: AC | PRN
  Administered 2024-12-03: 100 mL via INTRAVENOUS

## 2024-12-03 MED ORDER — METRONIDAZOLE 500 MG PO TABS: 500.0000 mg | ORAL_TABLET | Freq: Three times a day (TID) | ORAL | 0 refills | Status: AC

## 2024-12-03 MED ORDER — METRONIDAZOLE 500 MG PO TABS
500.0000 mg | ORAL_TABLET | Freq: Once | ORAL | Status: AC
  Administered 2024-12-03: 500 mg via ORAL
  Filled 2024-12-03: qty 1

## 2024-12-03 MED ORDER — CIPROFLOXACIN HCL 500 MG PO TABS
500.0000 mg | ORAL_TABLET | Freq: Once | ORAL | Status: AC
  Administered 2024-12-03: 500 mg via ORAL
  Filled 2024-12-03: qty 1

## 2024-12-03 NOTE — Discharge Instructions (Addendum)
 Please read and follow all provided instructions.  Your diagnoses today include:  1. Acute diverticulitis     Tests performed today include: Complete blood cell count: Normal red and white blood cell count Complete metabolic panel: Normal liver and kidney function Lipase (pancreas function test): Normal pancreas test Urinalysis (urine test): No sign of infection CT scan of the abdomen pelvis shows acute uncomplicated diverticulitis explaining your symptoms, you also have some kidney cysts which appear benign and do not need follow-up.  Vital signs. See below for your results today.   Medications prescribed:  Ciprofloxacin  - antibiotic  You have been prescribed an antibiotic medicine: take the entire course of medicine even if you are feeling better. Stopping early can cause the antibiotic not to work.  Metronidazole  - antibiotic  You have been prescribed an antibiotic medicine: take the entire course of medicine even if you are feeling better. Stopping early can cause the antibiotic not to work. Do not drink alcohol when taking this medication.   Take any prescribed medications only as directed.  Home care instructions:  Follow any educational materials contained in this packet.  Follow-up instructions: Please follow-up with your primary care provider in the next 3 days for further evaluation of your symptoms.    Return instructions:  SEEK IMMEDIATE MEDICAL ATTENTION IF: The pain does not go away or becomes severe  A temperature above 101F develops  Repeated vomiting occurs (multiple episodes)  The pain becomes localized to portions of the abdomen. The right side could possibly be appendicitis. In an adult, the left lower portion of the abdomen could be colitis or diverticulitis.  Blood is being passed in stools or vomit (bright red or black tarry stools)  You develop chest pain, difficulty breathing, dizziness or fainting, or become confused, poorly responsive, or inconsolable  (young children) If you have any other emergent concerns regarding your health  Additional Information: Abdominal (belly) pain can be caused by many things. Your caregiver performed an examination and possibly ordered blood/urine tests and imaging (CT scan, x-rays, ultrasound). Many cases can be observed and treated at home after initial evaluation in the emergency department. Even though you are being discharged home, abdominal pain can be unpredictable. Therefore, you need a repeated exam if your pain does not resolve, returns, or worsens. Most patients with abdominal pain don't have to be admitted to the hospital or have surgery, but serious problems like appendicitis and gallbladder attacks can start out as nonspecific pain. Many abdominal conditions cannot be diagnosed in one visit, so follow-up evaluations are very important.  Your vital signs today were: BP (!) 196/77 (BP Location: Right Arm)   Pulse 69   Temp (!) 97.5 F (36.4 C)   Resp 18   SpO2 95%  If your blood pressure (bp) was elevated above 135/85 this visit, please have this repeated by your doctor within one month. --------------

## 2024-12-03 NOTE — ED Provider Notes (Signed)
 [facility] AT [facility] Provider Note   CSN: [id number] Arrival date & time: 12/03/24  1640     Patient presents with: Abdominal Pain   Dale Fox is a 78 y.o. male.   Patient with remote history of hernia repair presents to the emergency department for evaluation of left-sided abdominal pain.  Patient has a history of hypertension, high cholesterol and diabetes.  He states that late last night developed pain in the left side of his abdomen which doubled me over.  No associated nausea, vomiting, diarrhea or constipation.  Patient tried to have a bowel movement but this did not help.  No urinary symptoms.  Pain has improved earlier today but he did go to urgent care for continued symptoms.  There was concern for diverticulitis and he was referred to the emergency department.  He has never had this in the past.  He reports history of colonoscopy, polyp removal.  Review of colonoscopy 02/2022 did comment on some scattered diverticula.       Prior to Admission medications   Medication Sig Start Date End Date Taking? Authorizing Provider  amLODipine (NORVASC) 5 MG tablet Take by mouth. 01/08/21   [provider]  aspirin 81 MG EC tablet Take by mouth.    [provider]  atorvastatin (LIPITOR) 10 MG tablet Take 1 tablet by mouth daily. 04/08/21   [provider]  carvedilol (COREG) 6.25 MG tablet Take by mouth. 07/02/21   [provider]  famotidine (PEPCID) 20 MG tablet TAKE 1 TABLET BY MOUTH ONCE DAILY AT BEDTIME AS NEEDED FOR 90 DAYS 01/03/21   [provider]  gabapentin (NEURONTIN) 100 MG capsule Take 100 mg by mouth daily. 04/30/21   [provider]  Lancets (ONETOUCH DELICA PLUS LANCET33G) MISC USE TO CHECK YOUR BLOOD SUGAR TO OBTAIN BLOOD ONCE DAILY 02/05/21   [provider]  levocetirizine (XYZAL) 5 MG tablet Take by mouth.    [provider]  losartan-hydrochlorothiazide  (HYZAAR) 100-12.5 MG tablet Take 1 tablet by mouth daily. 04/03/21   [provider]  meloxicam  (MOBIC ) 15 MG tablet Take 1 tablet (15 mg total) by mouth daily. 11/19/24   Magdalen Pasco RAMAN, DPM  metFORMIN (GLUCOPHAGE-XR) 500 MG 24 hr tablet SMARTSIG:1 Tablet(s) By Mouth Every Evening 04/30/21   [provider]  montelukast (SINGULAIR) 10 MG tablet Take 10 mg by mouth daily. 11/10/21   [provider]  Multiple Vitamin (MULTIVITAMIN) tablet Take 1 tablet by mouth daily.    [provider]  AISHA SINKS test strip SMARTSIG:Strip(s) Via Meter Daily 10/01/21   [provider]  pantoprazole (PROTONIX) 40 MG tablet Take 40 mg by mouth 2 (two) times daily. 11/20/21   [provider]  vitamin C (ASCORBIC ACID) 500 MG tablet Take 500 mg by mouth daily.    [provider]    Allergies: Amoxicillin-pot clavulanate, Paxlovid [nirmatrelvir-ritonavir], and Tizanidine    Review of Systems  Updated Vital Signs BP (!) 196/77 (BP Location: Right Arm)   Pulse 69   Temp (!) 97.5 F (36.4 C)   Resp 18   SpO2 95%   Physical Exam Vitals and nursing note reviewed.  Constitutional:      General: He is not in acute distress.    Appearance: He is well-developed.  HENT:     Head: Normocephalic and atraumatic.  Eyes:     General:        Right eye: No discharge.  Left eye: No discharge.     Conjunctiva/sclera: Conjunctivae normal.  Cardiovascular:     Rate and Rhythm: Normal rate and regular rhythm.     Heart sounds: Normal heart sounds.  Pulmonary:     Effort: Pulmonary effort is normal.     Breath sounds: Normal breath sounds.  Abdominal:     Palpations: Abdomen is soft.     Tenderness: There is abdominal tenderness in the suprapubic area and left lower quadrant. There is no guarding or rebound. Negative signs include Murphy's sign and McBurney's sign.  Musculoskeletal:     Cervical back: Normal range of motion and neck supple.   Skin:    General: Skin is warm and dry.  Neurological:     Mental Status: He is alert.     (all labs ordered are listed, but only abnormal results are displayed) Labs Reviewed  URINALYSIS, ROUTINE W REFLEX MICROSCOPIC - Abnormal; Notable for the following components:      Result Value   Protein, ur TRACE (*)    All other components within normal limits  LIPASE, BLOOD  COMPREHENSIVE METABOLIC PANEL WITH GFR  CBC    EKG: None  Radiology: CT ABDOMEN PELVIS W CONTRAST Result Date: 12/03/2024 CLINICAL DATA:  Left lower quadrant pain EXAM: CT ABDOMEN AND PELVIS WITH CONTRAST TECHNIQUE: Multidetector CT imaging of the abdomen and pelvis was performed using the standard protocol following bolus administration of intravenous contrast. RADIATION DOSE REDUCTION: This exam was performed according to the departmental dose-optimization program which includes automated exposure control, adjustment of the mA and/or kV according to patient size and/or use of iterative reconstruction technique. CONTRAST:  OMNIPAQUE  IOHEXOL  300 MG/ML  SOLN COMPARISON:  None Available. FINDINGS: Lower chest: Lung bases demonstrate no acute airspace disease. Atelectasis or scarring at the left base. Hepatobiliary: Subcentimeter hypodensities too small to further characterize. No calcified gallstone or biliary dilatation Pancreas: Unremarkable. No pancreatic ductal dilatation or surrounding inflammatory changes. Spleen: Normal in size without focal abnormality. Adrenals/Urinary Tract: Adrenal glands are normal. No hydronephrosis. Cysts and parapelvic cysts for which no imaging follow-up is recommended. Bladder is unremarkable Stomach/Bowel: Stomach within normal limits. No dilated small bowel. Diverticular disease of the colon. Mild wall thickening and focal inflammatory change at the mid descending colon, series 4, image 56 and series 2 image 38, consistent with acute diverticulitis. No perforation or abscess  Vascular/Lymphatic: Aortic atherosclerosis. No enlarged abdominal or pelvic lymph nodes. Reproductive: Multiple prostate seeds Other: Negative for ascites.  No free air Musculoskeletal: No acute or suspicious osseous abnormality. Multilevel degenerative changes of the spine IMPRESSION: 1. Findings consistent with acute diverticulitis of the mid descending colon. No perforation or abscess. 2. Aortic atherosclerosis. Aortic Atherosclerosis (ICD10-I70.0). Electronically Signed   By: Luke Bun M.D.   On: 12/03/2024 18:42     Procedures   Medications Ordered in the ED - No data to display ED Course  Patient seen and examined. History obtained directly from patient and family member at bedside.  I reviewed previous colonoscopy notes as well as notes from walk-in clinic.  Labs/EKG: Personally reviewed and interpreted labs ordered in triage including CBC showing normal white blood cell count, CMP unremarkable; lipase normal; UA unremarkable.  Imaging: Ordered CT abdomen and pelvis to evaluate left lower quadrant abdominal pain and to evaluate for possibility of diverticulitis or other cause.  Medications/Fluids: None ordered  Most recent vital signs reviewed and are as follows: BP (!) 196/77 (BP Location: Right Arm)   Pulse 69  Temp (!) 97.5 F (36.4 C)   Resp 18   SpO2 95%   Initial impression: Patient appears very comfortable except during palpation during exam.  7:01 PM Reassessment performed. Patient appears unchanged.   Imaging personally visualized and interpreted including: Agree with acute uncomplicated diverticulitis, renal cysts noted.  Reviewed pertinent lab work and imaging with patient at bedside. Questions answered.   Most current vital signs reviewed and are as follows: BP (!) 196/77 (BP Location: Right Arm)   Pulse 69   Temp (!) 97.5 F (36.4 C)   Resp 18   SpO2 95%   Plan: Discharge to home.  First dose of antibiotics given in the ED.  Prescriptions written  for: Cipro , Flagyl .  Other home care instructions discussed: Maintain good hydration  ED return instructions discussed: The patient was urged to return to the Emergency Department immediately with worsening of current symptoms, worsening abdominal pain, persistent vomiting, blood noted in stools, fever, or any other concerns. The patient verbalized understanding.   Follow-up instructions discussed: Patient encouraged to follow-up with their PCP in 3 days.                                   Medical Decision Making Amount and/or Complexity of Data Reviewed Labs: ordered. Radiology: ordered.  Risk Prescription drug management.   For this patient's complaint of abdominal pain, the following conditions were considered on the differential diagnosis: gastritis/PUD, enteritis/duodenitis, appendicitis, cholelithiasis/cholecystitis, cholangitis, pancreatitis, ruptured viscus, colitis, diverticulitis, small/large bowel obstruction, proctitis, cystitis, pyelonephritis, ureteral colic, aortic dissection, aortic aneurysm. Atypical chest etiologies were also considered including ACS, PE, and pneumonia.  Concern for diverticulitis, CT scan demonstrates acute diverticulitis without complication.  The patient's vital signs, pertinent lab work and imaging were reviewed and interpreted as discussed in the ED course. Hospitalization was considered for further testing, treatments, or serial exams/observation. However as patient is well-appearing, has a stable exam, and reassuring studies today, I do not feel that they warrant admission at this time. This plan was discussed with the patient who verbalizes agreement and comfort with this plan and seems reliable and able to return to the Emergency Department with worsening or changing symptoms.       Final diagnoses:  Acute diverticulitis  Elevated blood pressure reading    ED Discharge Orders          Ordered    ciprofloxacin  (CIPRO ) 500 MG tablet  Every  12 hours        12/03/24 1856    metroNIDAZOLE  (FLAGYL ) 500 MG tablet  3 times daily        12/03/24 1856               Desiderio Chew, PA-C 12/03/24 1904    Freddi Hamilton, MD 12/03/24 [id number]

## 2024-12-03 NOTE — ED Triage Notes (Signed)
 Sent from urgent care for left sided abd pain that started last pm. TTP left side when provider palpated. Denies N/V/D. Last BM today and normal. Denies fever or chills.

## 2024-12-24 ENCOUNTER — Other Ambulatory Visit (HOSPITAL_BASED_OUTPATIENT_CLINIC_OR_DEPARTMENT_OTHER): Payer: Self-pay | Admitting: Physician Assistant

## 2024-12-24 DIAGNOSIS — R10A2 Flank pain, left side: Secondary | ICD-10-CM

## 2024-12-25 ENCOUNTER — Encounter (HOSPITAL_BASED_OUTPATIENT_CLINIC_OR_DEPARTMENT_OTHER): Payer: Self-pay

## 2024-12-25 ENCOUNTER — Ambulatory Visit (HOSPITAL_BASED_OUTPATIENT_CLINIC_OR_DEPARTMENT_OTHER)
Admission: RE | Admit: 2024-12-25 | Discharge: 2024-12-25 | Disposition: A | Discharge status: Home / Self Care | Source: Ambulatory Visit | Attending: Physician Assistant | Admitting: Physician Assistant

## 2024-12-25 DIAGNOSIS — R10A2 Flank pain, left side: Secondary | ICD-10-CM | POA: Insufficient documentation

## 2024-12-25 MED ORDER — IOHEXOL 300 MG/ML  SOLN
100.0000 mL | Freq: Once | INTRAMUSCULAR | Status: AC | PRN
  Administered 2024-12-25: 100 mL via INTRAVENOUS
